# Patient Record
Sex: Female | Born: 1980 | Race: Black or African American | Hispanic: No | Marital: Married | Smoking: Never smoker
Health system: Southern US, Community
[De-identification: ages and names within clinical notes are randomized; demographics above are authoritative.]

## PROBLEM LIST (undated history)

## (undated) ENCOUNTER — Inpatient Hospital Stay (HOSPITAL_COMMUNITY): Payer: Self-pay

## (undated) DIAGNOSIS — O24419 Gestational diabetes mellitus in pregnancy, unspecified control: Secondary | ICD-10-CM

## (undated) DIAGNOSIS — Z9889 Other specified postprocedural states: Secondary | ICD-10-CM

## (undated) DIAGNOSIS — T4145XA Adverse effect of unspecified anesthetic, initial encounter: Secondary | ICD-10-CM

## (undated) DIAGNOSIS — R112 Nausea with vomiting, unspecified: Secondary | ICD-10-CM

## (undated) DIAGNOSIS — T8859XA Other complications of anesthesia, initial encounter: Secondary | ICD-10-CM

---

## 2009-04-05 ENCOUNTER — Other Ambulatory Visit: Admission: RE | Admit: 2009-04-05 | Discharge: 2009-04-05 | Payer: Self-pay | Admitting: Obstetrics and Gynecology

## 2011-09-20 ENCOUNTER — Emergency Department (HOSPITAL_COMMUNITY): Payer: Self-pay

## 2011-09-20 ENCOUNTER — Emergency Department (HOSPITAL_COMMUNITY)
Admission: EM | Admit: 2011-09-20 | Discharge: 2011-09-20 | Disposition: A | Discharge status: Home / Self Care | Payer: Self-pay | Attending: Emergency Medicine | Admitting: Emergency Medicine

## 2011-09-20 DIAGNOSIS — N949 Unspecified condition associated with female genital organs and menstrual cycle: Secondary | ICD-10-CM | POA: Insufficient documentation

## 2011-09-20 DIAGNOSIS — R109 Unspecified abdominal pain: Secondary | ICD-10-CM | POA: Insufficient documentation

## 2011-09-20 DIAGNOSIS — N83209 Unspecified ovarian cyst, unspecified side: Secondary | ICD-10-CM | POA: Insufficient documentation

## 2011-09-20 DIAGNOSIS — R10819 Abdominal tenderness, unspecified site: Secondary | ICD-10-CM | POA: Insufficient documentation

## 2011-09-20 DIAGNOSIS — R300 Dysuria: Secondary | ICD-10-CM | POA: Insufficient documentation

## 2011-09-20 DIAGNOSIS — N898 Other specified noninflammatory disorders of vagina: Secondary | ICD-10-CM | POA: Insufficient documentation

## 2011-09-20 LAB — BASIC METABOLIC PANEL
BUN: 10 mg/dL (ref 6–23)
CO2: 28 mEq/L (ref 19–32)
Calcium: 10.2 mg/dL (ref 8.4–10.5)
Chloride: 104 mEq/L (ref 96–112)
Creatinine, Ser: 0.85 mg/dL (ref 0.50–1.10)
GFR calc Af Amer: >90 mL/min (ref >90)
GFR calc non Af Amer: >90 mL/min (ref >90)
Glucose, Bld: 101 mg/dL — ABNORMAL HIGH (ref 70–99)
Potassium: 4.1 mEq/L (ref 3.5–5.1)
Sodium: 141 mEq/L (ref 135–145)

## 2011-09-20 LAB — URINALYSIS, ROUTINE W REFLEX MICROSCOPIC
Bilirubin Urine: NEGATIVE
Glucose, UA: NEGATIVE mg/dL
Ketones, ur: NEGATIVE mg/dL
Leukocytes, UA: NEGATIVE
Nitrite: NEGATIVE
Protein, ur: NEGATIVE mg/dL
Specific Gravity, Urine: 1.014 (ref 1.005–1.030)
Urobilinogen, UA: 0.2 mg/dL (ref 0.0–1.0)
pH: 6 (ref 5.0–8.0)

## 2011-09-20 LAB — DIFFERENTIAL
Basophils Absolute: 0 10*3/uL (ref 0.0–0.1)
Basophils Relative: 0 % (ref 0–1)
Eosinophils Absolute: 0.4 10*3/uL (ref 0.0–0.7)
Eosinophils Relative: 3 % (ref 0–5)
Lymphocytes Relative: 31 % (ref 12–46)
Lymphs Abs: 3.4 10*3/uL (ref 0.7–4.0)
Monocytes Absolute: 0.6 10*3/uL (ref 0.1–1.0)
Monocytes Relative: 6 % (ref 3–12)
Neutro Abs: 6.5 10*3/uL (ref 1.7–7.7)
Neutrophils Relative %: 60 % (ref 43–77)

## 2011-09-20 LAB — CBC
HCT: 35.6 % — ABNORMAL LOW (ref 36.0–46.0)
Hemoglobin: 12.4 g/dL (ref 12.0–15.0)
MCH: 29.5 pg (ref 26.0–34.0)
MCHC: 34.8 g/dL (ref 30.0–36.0)
MCV: 84.6 fL (ref 78.0–100.0)
Platelets: 312 10*3/uL (ref 150–400)
RBC: 4.21 MIL/uL (ref 3.87–5.11)
RDW: 12.5 % (ref 11.5–15.5)
WBC: 10.8 10*3/uL — ABNORMAL HIGH (ref 4.0–10.5)

## 2011-09-20 LAB — WET PREP, GENITAL
Clue Cells Wet Prep HPF POC: NONE SEEN
Trich, Wet Prep: NONE SEEN
Yeast Wet Prep HPF POC: NONE SEEN

## 2011-09-20 LAB — POCT PREGNANCY, URINE: Preg Test, Ur: NEGATIVE

## 2011-09-22 LAB — GC/CHLAMYDIA PROBE AMP, GENITAL
Chlamydia, DNA Probe: NEGATIVE
GC Probe Amp, Genital: NEGATIVE

## 2012-02-20 ENCOUNTER — Encounter (HOSPITAL_COMMUNITY): Payer: Self-pay | Admitting: *Deleted

## 2012-02-20 ENCOUNTER — Emergency Department (HOSPITAL_COMMUNITY): Payer: Medicaid Other

## 2012-02-20 ENCOUNTER — Emergency Department (HOSPITAL_COMMUNITY)
Admission: EM | Admit: 2012-02-20 | Discharge: 2012-02-21 | Disposition: A | Discharge status: Home / Self Care | Payer: Medicaid Other | Attending: Emergency Medicine | Admitting: Emergency Medicine

## 2012-02-20 DIAGNOSIS — Z3201 Encounter for pregnancy test, result positive: Secondary | ICD-10-CM

## 2012-02-20 DIAGNOSIS — O2690 Pregnancy related conditions, unspecified, unspecified trimester: Secondary | ICD-10-CM | POA: Insufficient documentation

## 2012-02-20 DIAGNOSIS — O26899 Other specified pregnancy related conditions, unspecified trimester: Secondary | ICD-10-CM

## 2012-02-20 DIAGNOSIS — R109 Unspecified abdominal pain: Secondary | ICD-10-CM | POA: Insufficient documentation

## 2012-02-20 LAB — URINALYSIS, ROUTINE W REFLEX MICROSCOPIC
Bilirubin Urine: NEGATIVE
Glucose, UA: 100 mg/dL — AB
Hgb urine dipstick: NEGATIVE
Ketones, ur: NEGATIVE mg/dL
Nitrite: NEGATIVE
Protein, ur: NEGATIVE mg/dL
Specific Gravity, Urine: 1.012 (ref 1.005–1.030)
Urobilinogen, UA: 0.2 mg/dL (ref 0.0–1.0)
pH: 8 (ref 5.0–8.0)

## 2012-02-20 LAB — URINE MICROSCOPIC-ADD ON

## 2012-02-20 LAB — WET PREP, GENITAL
Clue Cells Wet Prep HPF POC: NONE SEEN
Trich, Wet Prep: NONE SEEN
Yeast Wet Prep HPF POC: NONE SEEN

## 2012-02-20 LAB — POCT PREGNANCY, URINE: Preg Test, Ur: POSITIVE — AB

## 2012-02-20 LAB — HCG, QUANTITATIVE, PREGNANCY: hCG, Beta Chain, Quant, S: 58013 m[IU]/mL — ABNORMAL HIGH (ref <5)

## 2012-02-20 NOTE — ED Provider Notes (Signed)
History     CSN: [id number]  Arrival date & time 02/20/12  1843   First MD Initiated Contact with Patient 02/20/12 2131      Chief Complaint  Patient presents with  . Abdominal Pain    (Consider location/radiation/quality/duration/timing/severity/associated sxs/prior treatment) HPI  G4 P3 female who is [redacted] weeks pregnant presents with chief complaints of suprapubic/right lower quadrant abdominal pain. States pain started this a.m. while she was sitting. Pain is acute in onset, worsening with sitting, improves with laying down. Pain is constant, sharp and radiating around her abdomen. She has nausea but states she has been having nausea since pregnancy. She denies vomiting, diarrhea, constipation, dysuria, abnormal vaginal bleeding, or vaginal discharge. She denies any recent trauma, denies any rash. Patient denies fever, chest pain, shortness of breath, or back pain. She was diagnosed with bacterial vaginosis 4 days ago and currently taking MetroGel.  History reviewed. No pertinent past medical history.  History reviewed. No pertinent past surgical history.  No family history on file.  History  Substance Use Topics  . Smoking status: Not on file  . Smokeless tobacco: Not on file  . Alcohol Use: No    OB History    Grav Para Term Preterm Abortions TAB SAB Ect Mult Living   1               Review of Systems  All other systems reviewed and are negative.    Allergies  Review of patient's allergies indicates no known allergies.  Home Medications   Current Outpatient Rx  Name Route Sig Dispense Refill  . PRENATAL 27-0.8 MG PO TABS Oral Take 1 tablet by mouth daily.      BP 121/74  Pulse 100  Temp(Src) 98 F (36.7 C) (Oral)  Resp 18  SpO2 100%  LMP 12/31/2011  Physical Exam  Nursing note and vitals reviewed. Constitutional: She appears well-developed and well-nourished. No distress.  HENT:  Head: Normocephalic and atraumatic.  Eyes: Conjunctivae are normal.    Neck: Normal range of motion. Neck supple.  Cardiovascular: Normal rate and regular rhythm.   Pulmonary/Chest: Effort normal and breath sounds normal. She exhibits no tenderness.  Abdominal: Soft. There is tenderness in the suprapubic area.  Genitourinary: Pelvic exam was performed with patient supine. There is no rash or lesion on the right labia. There is no rash or lesion on the left labia. Uterus is tender. Cervix exhibits discharge. Cervix exhibits no motion tenderness. Right adnexum displays no mass and no tenderness. Left adnexum displays tenderness. Left adnexum displays no mass. No erythema, tenderness or bleeding around the vagina. Vaginal discharge found.  Lymphadenopathy:       Right: No inguinal adenopathy present.       Left: No inguinal adenopathy present.    ED Course  Procedures (including critical care time)  Labs Reviewed  POCT PREGNANCY, URINE - Abnormal; Notable for the following:    Preg Test, Ur POSITIVE (*)    All other components within normal limits  URINALYSIS, ROUTINE W REFLEX MICROSCOPIC   No results found.   No diagnosis found.  Results for orders placed during the hospital encounter of 02/20/12  URINALYSIS, ROUTINE W REFLEX MICROSCOPIC      Component Value Range   Color, Urine YELLOW  YELLOW    APPearance CLEAR  CLEAR    Specific Gravity, Urine 1.012  1.005 - 1.030    pH 8.0  5.0 - 8.0    Glucose, UA 100 (*) NEGATIVE (mg/dL)  Hgb urine dipstick NEGATIVE  NEGATIVE    Bilirubin Urine NEGATIVE  NEGATIVE    Ketones, ur NEGATIVE  NEGATIVE (mg/dL)   Protein, ur NEGATIVE  NEGATIVE (mg/dL)   Urobilinogen, UA 0.2  0.0 - 1.0 (mg/dL)   Nitrite NEGATIVE  NEGATIVE    Leukocytes, UA SMALL (*) NEGATIVE   POCT PREGNANCY, URINE      Component Value Range   Preg Test, Ur POSITIVE (*) NEGATIVE   URINE MICROSCOPIC-ADD ON      Component Value Range   Squamous Epithelial / LPF MANY (*) RARE    WBC, UA 3-6  <3 (WBC/hpf)   RBC / HPF 0-2  <3 (RBC/hpf)    Bacteria, UA FEW (*) RARE   HCG, QUANTITATIVE, PREGNANCY      Component Value Range   hCG, Beta Chain, Quant, S 58013 (*) <5 (mIU/mL)  WET PREP, GENITAL      Component Value Range   Yeast Wet Prep HPF POC NONE SEEN  NONE SEEN    Trich, Wet Prep NONE SEEN  NONE SEEN    Clue Cells Wet Prep HPF POC NONE SEEN  NONE SEEN    WBC, Wet Prep HPF POC FEW (*) NONE SEEN    US Ob Comp Less 14 Wks  02/21/2012  *RADIOLOGY REPORT*  Clinical Data: ABDOMINAL PAIN pregnancy,preg; ;  OBSTETRIC <14 WK [place] AND TRANSVAGINAL OB US  Technique: Both transabdominal and transvaginal ultrasound examinations were performed for complete evaluation of the gestation as well as the maternal uterus, adnexal regions, and pelvic cul-de-sac.  Comparison: None.  Findings: There is a single intrauterine pregnancy.  Crown-rump length is 10.3 cm for estimated gestational age of [redacted] weeks 1 day. Fetal heart rate 133 beats per minute.  No subchorionic hemorrhage.  Ovaries are symmetric in size and echotexture.  No adnexal mass. Right corpus luteal cyst.  No free fluid.  IMPRESSION: 7-week-1-day intrauterine pregnancy.  Fetal heart rate 103 beats per minute.  Original Report Authenticated By: Cyndie Chime, M.D.   US Ob Transvaginal  02/21/2012  *RADIOLOGY REPORT*  Clinical Data: ABDOMINAL PAIN pregnancy,preg; ;  OBSTETRIC <14 WK [place] AND TRANSVAGINAL OB US  Technique: Both transabdominal and transvaginal ultrasound examinations were performed for complete evaluation of the gestation as well as the maternal uterus, adnexal regions, and pelvic cul-de-sac.  Comparison: None.  Findings: There is a single intrauterine pregnancy.  Crown-rump length is 10.3 cm for estimated gestational age of [redacted] weeks 1 day. Fetal heart rate 133 beats per minute.  No subchorionic hemorrhage.  Ovaries are symmetric in size and echotexture.  No adnexal mass. Right corpus luteal cyst.  No free fluid.  IMPRESSION: 7-week-1-day intrauterine pregnancy.  Fetal heart rate 103  beats per minute.  Original Report Authenticated By: Cyndie Chime, M.D.      MDM  Abdominal pain, currently pregnant. Will obtain Quant, pelvic examination, and transvaginal ultrasound for further evaluation. Consider musculoskeletal pain, UTI, appy, ovarian cyst, ectopic, tuboovarian abscess, ovarian torsion.  11:16 PM Urine pregnancy test is positive. HCG Quant is 58,000. UA shows no significant evidence of urinary tract infection. Pelvic examination is significant for mild cervical tenderness and moderate curd-like discharge.  Some tenderness to left adnexa. Cervical os is closed.     12:46 AM No significant finding on today's exam.  Pt sts pain has improved.  She has an appetite and able to eat.  No signs of infection.  US shows a 7 weeks and 1 day intrauterine pregnancy with fetal  heart rate of 103 bpm.  Reassurance given.  Recommend f/u with OBGYN. Pt voice understanding and agrees with plan.    Fayrene Helper, PA-C 02/21/12 0047  Fayrene Helper, PA-C 02/21/12 [phone]

## 2012-02-20 NOTE — ED Notes (Signed)
Pt is pregnant.  LMP 2/6.  She is here for RLQ abdominal pain.  Pt has nausea related to pregnancy

## 2012-02-21 NOTE — Discharge Instructions (Signed)
Abdominal Pain During Pregnancy °Belly (abdominal) pain is common during pregnancy. Most of the time, it is not a serious problem. Other times, it can be a sign that something is wrong with the pregnancy. Always tell your doctor if you have belly pain. °HOME CARE °For mild pain: °· Do not have sex (intercourse) or put anything in your vagina until you feel better.  °· Rest until your pain stops. If your pain lasts longer than 1 hour, call your doctor.  °· Drink clear fluids if you feel sick to your stomach (nauseous).  °· Do not eat solid food until you feel better.  °· Only take medicine as told by your doctor.  °· Keep all doctor visits as told.  °GET HELP RIGHT AWAY IF:  °· You are bleeding, leaking fluid, or pieces of tissue come out of your vagina.  °· You have more pain or cramping.  °· You keep throwing up (vomiting).  °· You have pain when you pee (urinate) or have blood in your pee.  °· You have a fever.  °· You do not feel your baby moving as much.  °· You feel very weak or feel like passing out.  °· You have trouble breathing, with or without belly pain.  °· You have a very bad headache and belly pain.  °· You have fluid leaking from your vagina and belly pain.  °· You keep having watery poop (diarrhea).  °· Your belly pain does not go away after resting, or the pain gets worse.  °MAKE SURE YOU:  °· Understand these instructions.  °· Will watch your condition.  °· Will get help right away if you are not doing well or get worse.  °Document Released: 10/29/2009 Document Revised: 10/30/2011 Document Reviewed: 06/06/2011 °[employer]® Patient Information ©2012 [employer]. °

## 2012-02-21 NOTE — ED Notes (Signed)
Patient transported from Ultrasound 

## 2012-02-22 NOTE — ED Provider Notes (Signed)
Medical screening examination/treatment/procedure(s) were performed by non-physician practitioner and as supervising physician I was immediately available for consultation/collaboration.  Ruthanna Macchia R. Srihitha Tagliaferri, MD 02/22/12 0015 

## 2012-02-23 LAB — GC/CHLAMYDIA PROBE AMP, GENITAL
Chlamydia, DNA Probe: NEGATIVE
GC Probe Amp, Genital: NEGATIVE

## 2012-03-23 LAB — OB RESULTS CONSOLE RUBELLA ANTIBODY, IGM: Rubella: IMMUNE

## 2012-03-23 LAB — OB RESULTS CONSOLE ABO/RH: RH Type: POSITIVE

## 2012-03-23 LAB — OB RESULTS CONSOLE RPR: RPR: NONREACTIVE

## 2012-03-23 LAB — OB RESULTS CONSOLE HIV ANTIBODY (ROUTINE TESTING): HIV: NONREACTIVE

## 2012-03-23 LAB — OB RESULTS CONSOLE HEPATITIS B SURFACE ANTIGEN: Hepatitis B Surface Ag: NEGATIVE

## 2012-03-23 LAB — OB RESULTS CONSOLE ANTIBODY SCREEN: Antibody Screen: NEGATIVE

## 2012-05-12 ENCOUNTER — Inpatient Hospital Stay (HOSPITAL_COMMUNITY)
Admission: AD | Admit: 2012-05-12 | Discharge: 2012-05-12 | Disposition: A | Discharge status: Hospice | Payer: Medicaid Other | Source: Ambulatory Visit | Attending: Obstetrics and Gynecology | Admitting: Obstetrics and Gynecology

## 2012-05-12 ENCOUNTER — Encounter (HOSPITAL_COMMUNITY): Payer: Self-pay | Admitting: *Deleted

## 2012-05-12 DIAGNOSIS — M5380 Other specified dorsopathies, site unspecified: Secondary | ICD-10-CM | POA: Insufficient documentation

## 2012-05-12 DIAGNOSIS — R109 Unspecified abdominal pain: Secondary | ICD-10-CM | POA: Insufficient documentation

## 2012-05-12 DIAGNOSIS — O99891 Other specified diseases and conditions complicating pregnancy: Secondary | ICD-10-CM | POA: Insufficient documentation

## 2012-05-12 DIAGNOSIS — M549 Dorsalgia, unspecified: Secondary | ICD-10-CM | POA: Insufficient documentation

## 2012-05-12 DIAGNOSIS — Z331 Pregnant state, incidental: Secondary | ICD-10-CM

## 2012-05-12 DIAGNOSIS — M62830 Muscle spasm of back: Secondary | ICD-10-CM

## 2012-05-12 LAB — URINE MICROSCOPIC-ADD ON

## 2012-05-12 LAB — URINALYSIS, ROUTINE W REFLEX MICROSCOPIC
Bilirubin Urine: NEGATIVE
Glucose, UA: 100 mg/dL — AB
Ketones, ur: NEGATIVE mg/dL
Leukocytes, UA: NEGATIVE
Nitrite: NEGATIVE
Protein, ur: NEGATIVE mg/dL
Specific Gravity, Urine: 1.01 (ref 1.005–1.030)
Urobilinogen, UA: 0.2 mg/dL (ref 0.0–1.0)
pH: 7 (ref 5.0–8.0)

## 2012-05-12 MED ORDER — CYCLOBENZAPRINE HCL 10 MG PO TABS: 10.0000 mg | ORAL_TABLET | Freq: Once | ORAL | Status: AC

## 2012-05-12 MED ORDER — CYCLOBENZAPRINE HCL 10 MG PO TABS
10.0000 mg | ORAL_TABLET | Freq: Once | ORAL | Status: AC
  Administered 2012-05-12: 10 mg via ORAL
  Filled 2012-05-12: qty 1

## 2012-05-12 NOTE — MAU Note (Signed)
Pt reports she just finished antibiotics for a UTI about 4 days ago, today began having lower abd and lower back pain. Lower back pain is constant. Denies bleeding.

## 2012-05-12 NOTE — Discharge Instructions (Signed)
Back Pain in Pregnancy  Back pain during pregnancy is common. It happens in about half of all pregnancies. It is important for you and your baby that you remain active during your pregnancy.If you feel that back pain is not allowing you to remain active or sleep well, it is time to see your caregiver. Back pain may be caused by several factors related to changes during your pregnancy.Fortunately, unless you had trouble with your back before your pregnancy, the pain is likely to get better after you deliver.  Low back pain usually occurs between the fifth and seventh months of pregnancy. It can, however, happen in the first couple months. Factors that increase the risk of back problems include:    Previous back problems.   Injury to your back.   Having twins or multiple births.   A chronic cough.   Stress.   Job-related repetitive motions.   Muscle or spinal disease in the back.   Family history of back problems, ruptured (herniated) discs, or osteoporosis.   Depression, anxiety, and panic attacks.  CAUSES    When you are pregnant, your body produces a hormone called relaxin. This hormonemakes the ligaments connecting the low back and pubic bones more flexible. This flexibility allows the baby to be delivered more easily. When your ligaments are loose, your muscles need to work harder to support your back. Soreness in your back can come from tired muscles. Soreness can also come from back tissues that are irritated since they are receiving less support.   As the baby grows, it puts pressure on the nerves and blood vessels in your pelvis. This can cause back pain.   As the baby grows and gets heavier during pregnancy, the uterus pushes the stomach muscles forward and changes your center of gravity. This makes your back muscles work harder to maintain good posture.  SYMPTOMS   Lumbar pain during pregnancy  Lumbar pain during pregnancy usually occurs at or above the waist in the center of the back. There  may be pain and numbness that radiates into your leg or foot. This is similar to low back pain experienced by non-pregnant women. It usually increases with sitting for long periods of time, standing, or repetitive lifting. Tenderness may also be present in the muscles along your upper back.  Posterior pelvic pain during pregnancy  Pain in the back of the pelvis is more common than lumbar pain in pregnancy. It is a deep pain felt in your side at the waistline, or across the tailbone (sacrum), or in both places. You may have pain on one or both sides. This pain can also go into the buttocks and backs of the upper thighs. Pubic and groin pain may also be present. The pain does not quickly resolve with rest, and morning stiffness may also be present.  Pelvic pain during pregnancy can be brought on by most activities. A high level of fitness before and during pregnancy may or may not prevent this problem. Labor pain is usually 1 to 2 minutes apart, lasts for about 1 minute, and involves a bearing down feeling or pressure in your pelvis. However, if you are at term with the pregnancy, constant low back pain can be the beginning of early labor, and you should be aware of this.  DIAGNOSIS   X-rays of the back should not be done during the first 12 to 14 weeks of the pregnancy and only when absolutely necessary during the rest of the pregnancy. MRIs do   not give off radiation and are safe during pregnancy. MRIs also should only be done when absolutely necessary.  HOME CARE INSTRUCTIONS   Exercise as directed by your caregiver. Exercise is the most effective way to prevent or manage back pain. If you have a back problem, it is especially important to avoid sports that require sudden body movements. Swimming and walking are great activities.   Do not stand in one place for long periods of time.   Do not wear high heels.   Sit in chairs with good posture. Use a pillow on your lower back if necessary. Make sure your head  rests over your shoulders and is not hanging forward.   Try sleeping on your side, preferably the left side, with a pillow or two between your legs. If you are sore after a night's rest, your bedmay betoo soft.Try placing a board between your mattress and box spring.   Listen to your body when lifting.If you are experiencing pain, ask for help or try bending yourknees more so you can use your leg muscles rather than your back muscles. Squat down when picking up something from the floor. Do not bend over.   Eat a healthy diet. Try to gain weight within your caregiver's recommendations.   Use heat or cold packs 3 to 4 times a day for 15 minutes to help with the pain.   Only take over-the-counter or prescription medicines for pain, discomfort, or fever as directed by your caregiver.  Sudden (acute) back pain   Use bed rest for only the most extreme, acute episodes of back pain. Prolonged bed rest over 48 hours will aggravate your condition.   Ice is very effective for acute conditions.   Put ice in a plastic bag.   Place a towel between your skin and the bag.   Leave the ice on for 10 to 20 minutes every 2 hours, or as needed.   Using heat packs for 30 minutes prior to activities is also helpful.  Continued back pain  See your caregiver if you have continued problems. Your caregiver can help or refer you for appropriate physical therapy. With conditioning, most back problems can be avoided. Sometimes, a more serious issue may be the cause of back pain. You should be seen right away if new problems seem to be developing. Your caregiver may recommend:   A maternity girdle.   An elastic sling.   A back brace.   A massage therapist or acupuncture.  SEEK MEDICAL CARE IF:    You are not able to do most of your daily activities, even when taking the pain medicine you were given.   You need a referral to a physical therapist or chiropractor.   You want to try acupuncture.  SEEK IMMEDIATE MEDICAL CARE  IF:   You develop numbness, tingling, weakness, or problems with the use of your arms or legs.   You develop severe back pain that is no longer relieved with medicines.   You have a sudden change in bowel or bladder control.   You have increasing pain in other areas of the body.   You develop shortness of breath, dizziness, or fainting.   You develop nausea, vomiting, or sweating.   You have back pain which is similar to labor pains.   You have back pain along with your water breaking or vaginal bleeding.   You have back pain or numbness that travels down your leg.   Your back pain developed after   kidney stone.   You see blood in your urine. You may have a bladder infection or kidney stone.   You have back pain with blisters. You may have shingles.  Back pain is fairly common during pregnancy but should not be accepted as just part of the process. Back pain should always be treated as soon as possible. This will make your pregnancy as pleasant as possible. Document Released: 02/18/2006 Document Revised: 10/30/2011 Document Reviewed: 04/01/2011 [facility] Patient Information 2012 [place], [place].Muscle Cramps Muscle cramps are due to sudden involuntary muscle contraction. This means you have no control over the tightening of a muscle (or muscles). Often there are no obvious causes. Muscle cramps may occur with overexertion. They may also occur with chilling of the muscles. An example of a muscle chilling activity is swimming. It is uncommon for cramps to be due to a serious underlying disorder. In most cases, muscle cramps improve (or leave) within minutes. CAUSES  Some common causes are:  Injury.   Infections, especially viral.   Abnormal levels of the salts and ions in your blood (electrolytes). This could happen if you are taking water pills (diuretics).   Blood vessel disease  where not enough blood is getting to the muscles (intermittent claudication).  Some uncommon causes are:  Side effects of some medicine (such as lithium).   Alcohol abuse.   Diseases where there is soreness (inflammation) of the muscular system.  HOME CARE INSTRUCTIONS   It may be helpful to massage, stretch, and relax the affected muscle.   Taking a dose of over-the-counter diphenhydramine is helpful for night leg cramps.  SEEK MEDICAL CARE IF:  Cramps are frequent and not relieved with medicine. MAKE SURE YOU:   Understand these instructions.   Will watch your condition.   Will get help right away if you are not doing well or get worse.  Document Released: 05/02/2002 Document Revised: 10/30/2011 Document Reviewed: 11/01/2008 [facility] Patient Information 2012 [place], [place].

## 2012-05-12 NOTE — MAU Note (Signed)
Pt c/o constant low back pain, intermittant, lower abd pain, and diarrhea X4 today.  Has not taken anything for the pain.

## 2012-05-12 NOTE — MAU Provider Note (Signed)
Chief Complaint:  Abdominal Pain and Back Pain    First Provider Initiated Contact with Patient 05/12/12 2135      Jacqueline Carter is  31 y.o. [id number].  Patient's last menstrual period was 12/31/2011.. [redacted]w[redacted]d  She presents complaining of Abdominal Pain and Back Pain . Onset is described as sudden and has been present for  2 hours. Reports sudden left lower back pain after sneezing and hard fetal kick. States she had some lower abd cramping at home but has now resolved. Pain is worse with movement and palpation. Reports + FM, denies vaginal bleeding, dysuria, LOF, fever, chills, Nausea or vomiting.   Reports she completed macrobid 2 days ago for UTI.   Obstetrical/Gynecological History: [id number]  Past Medical History: No past medical history on file.  Past Surgical History: No past surgical history on file.  Family History: No family history on file.  Social History: History  Substance Use Topics  . Smoking status: Not on file  . Smokeless tobacco: Not on file  . Alcohol Use: No    Allergies: No Known Allergies  Prescriptions prior to admission  Medication Sig Dispense Refill  . nitrofurantoin (MACRODANTIN) 100 MG capsule Take 100 mg by mouth 4 (four) times daily. uti      . Prenatal Vit-Fe Fumarate-FA (MULTIVITAMIN-PRENATAL) 27-0.8 MG TABS Take 1 tablet by mouth daily.        Review of Systems - Negative except what has been reviewed in the HPI  Physical Exam   Blood pressure 101/72, pulse 97, temperature 99.4 F (37.4 C), temperature source Oral, resp. rate 18, height 5\' 1"  (1.549 m), weight 203 lb (92.08 kg), last menstrual period 12/31/2011, SpO2 100.00%.  General: General appearance - alert, well appearing, and in no distress, oriented to person, place, and time and overweight Eyes - left eye normal, right eye normal Abdomen - soft, nontender, nondistended, no masses or organomegaly no CVA tenderness Back exam - + soft tissue tenderness of left lumbar paraspinal  muscles with palpation Focused Gynecological Exam: Cervix: closed/thick/long/firm  Labs: Recent Results (from the past 24 hour(s))  URINALYSIS, ROUTINE W REFLEX MICROSCOPIC   Collection Time   05/12/12  8:05 PM      Component Value Range   Color, Urine YELLOW  YELLOW   APPearance CLEAR  CLEAR   Specific Gravity, Urine 1.010  1.005 - 1.030   pH 7.0  5.0 - 8.0   Glucose, UA 100 (*) NEGATIVE mg/dL   Hgb urine dipstick TRACE (*) NEGATIVE   Bilirubin Urine NEGATIVE  NEGATIVE   Ketones, ur NEGATIVE  NEGATIVE mg/dL   Protein, ur NEGATIVE  NEGATIVE mg/dL   Urobilinogen, UA 0.2  0.0 - 1.0 mg/dL   Nitrite NEGATIVE  NEGATIVE   Leukocytes, UA NEGATIVE  NEGATIVE  URINE MICROSCOPIC-ADD ON   Collection Time   05/12/12  8:05 PM      Component Value Range   Squamous Epithelial / LPF RARE  RARE   WBC, UA 0-2  <3 WBC/hpf   RBC / HPF 0-2  <3 RBC/hpf   MD Consult: Discussed with Dr. Regis Bill  Assessment: 1. Muscle spasm of back   2. Pregnant state, incidental      Plan: Discharge home Flexeril in MAU, Rx #15 sent to pharmacy Comfort measures reviewed FU in office next Tuesday as scheduled  [name] 05/12/2012,9:48 PM

## 2012-08-11 ENCOUNTER — Encounter: Payer: Medicaid Other | Attending: Obstetrics and Gynecology | Admitting: *Deleted

## 2012-08-11 DIAGNOSIS — Z713 Dietary counseling and surveillance: Secondary | ICD-10-CM | POA: Insufficient documentation

## 2012-08-11 DIAGNOSIS — O99810 Abnormal glucose complicating pregnancy: Secondary | ICD-10-CM | POA: Insufficient documentation

## 2012-08-17 ENCOUNTER — Encounter: Payer: Self-pay | Admitting: *Deleted

## 2012-08-17 NOTE — Progress Notes (Signed)
  Patient was seen on 08/11/2012 for Gestational Diabetes self-management class at the [facility]. The following learning objectives were met by the patient during this course:   States the definition of Gestational Diabetes  States why dietary management is important in controlling blood glucose  Describes the effects each nutrient has on blood glucose levels  Demonstrates ability to create a balanced meal plan  Demonstrates carbohydrate counting   States when to check blood glucose levels  Demonstrates proper blood glucose monitoring techniques  States the effect of stress and exercise on blood glucose levels  States the importance of limiting caffeine and abstaining from alcohol and smoking  Blood glucose monitor given: Accu Chek Nano BG Monitoring Kit Lot # [id number] Exp: 12/2015 Blood glucose reading: 91 mg/dl  Patient instructed to monitor glucose levels: FBS: 60 - <90 2 hour: <120  *Patient received handouts:  Nutrition Diabetes and Pregnancy  Carbohydrate Counting List  Patient will be seen for follow-up as needed.

## 2012-08-17 NOTE — Patient Instructions (Signed)
Goals:  Check glucose levels per MD as instructed  Follow Gestational Diabetes Diet as instructed  Call for follow-up as needed    

## 2012-09-14 ENCOUNTER — Encounter (HOSPITAL_COMMUNITY): Payer: Self-pay | Admitting: Pharmacist

## 2012-09-20 ENCOUNTER — Encounter (HOSPITAL_COMMUNITY): Payer: Self-pay

## 2012-09-22 ENCOUNTER — Encounter (HOSPITAL_COMMUNITY)
Admission: RE | Admit: 2012-09-22 | Discharge: 2012-09-22 | Disposition: A | Discharge status: Home / Self Care | Payer: Medicaid Other | Source: Ambulatory Visit | Attending: Obstetrics & Gynecology | Admitting: Obstetrics & Gynecology

## 2012-09-22 ENCOUNTER — Encounter (HOSPITAL_COMMUNITY): Payer: Self-pay

## 2012-09-22 HISTORY — DX: Nausea with vomiting, unspecified: R11.2

## 2012-09-22 HISTORY — DX: Other specified postprocedural states: Z98.890

## 2012-09-22 HISTORY — DX: Other complications of anesthesia, initial encounter: T88.59XA

## 2012-09-22 HISTORY — DX: Adverse effect of unspecified anesthetic, initial encounter: T41.45XA

## 2012-09-22 LAB — CBC
HCT: 36.7 % (ref 36.0–46.0)
Hemoglobin: 12.5 g/dL (ref 12.0–15.0)
MCH: 29.8 pg (ref 26.0–34.0)
MCHC: 34.1 g/dL (ref 30.0–36.0)
MCV: 87.6 fL (ref 78.0–100.0)
Platelets: 235 10*3/uL (ref 150–400)
RBC: 4.19 MIL/uL (ref 3.87–5.11)
RDW: 13.5 % (ref 11.5–15.5)
WBC: 10.9 10*3/uL — ABNORMAL HIGH (ref 4.0–10.5)

## 2012-09-22 LAB — TYPE AND SCREEN
ABO/RH(D): A POS
Antibody Screen: NEGATIVE

## 2012-09-22 LAB — BASIC METABOLIC PANEL
BUN: 5 mg/dL — ABNORMAL LOW (ref 6–23)
CO2: 23 mEq/L (ref 19–32)
Calcium: 9.8 mg/dL (ref 8.4–10.5)
Chloride: 102 mEq/L (ref 96–112)
Creatinine, Ser: 0.64 mg/dL (ref 0.50–1.10)
GFR calc Af Amer: >90 mL/min (ref >90)
GFR calc non Af Amer: >90 mL/min (ref >90)
Glucose, Bld: 85 mg/dL (ref 70–99)
Potassium: 4.1 mEq/L (ref 3.5–5.1)
Sodium: 137 mEq/L (ref 135–145)

## 2012-09-22 LAB — SURGICAL PCR SCREEN
MRSA, PCR: NEGATIVE
Staphylococcus aureus: NEGATIVE

## 2012-09-22 LAB — RPR: RPR Ser Ql: NONREACTIVE

## 2012-09-22 LAB — ABO/RH: ABO/RH(D): A POS

## 2012-09-22 NOTE — Patient Instructions (Addendum)
20 Jacqueline Carter  09/22/2012   Your procedure is scheduled on:  09/29/12  Enter through the Main Entrance of [facility] at 930 AM.  Pick up the phone at the desk and dial [phone].   Call this number if you have problems the morning of surgery: [phone]   Remember:   Do not eat food:After Midnight.  Do not drink clear liquids: After Midnight.  Take these medicines the morning of surgery with A SIP OF WATER: NA   Do not wear jewelry, make-up or nail polish.  Do not wear lotions, powders, or perfumes. You may wear deodorant.  Do not shave 48 hours prior to surgery.  Do not bring valuables to the hospital.  Contacts, dentures or bridgework may not be worn into surgery.  Leave suitcase in the car. After surgery it may be brought to your room.  For patients admitted to the hospital, checkout time is 11:00 AM the day of discharge.   Patients discharged the day of surgery will not be allowed to drive home.  Name and phone number of your driver: NA  Special Instructions: Shower using CHG 2 nights before surgery and the night before surgery.  If you shower the day of surgery use CHG.  Use special wash - you have one bottle of CHG for all showers.  You should use approximately 1/3 of the bottle for each shower.   Please read over the following fact sheets that you were given: MRSA Information

## 2012-09-25 ENCOUNTER — Encounter (HOSPITAL_COMMUNITY): Payer: Self-pay | Admitting: *Deleted

## 2012-09-25 ENCOUNTER — Encounter (HOSPITAL_COMMUNITY): Payer: Self-pay | Admitting: Anesthesiology

## 2012-09-25 ENCOUNTER — Inpatient Hospital Stay (HOSPITAL_COMMUNITY): Payer: Medicaid Other | Admitting: Anesthesiology

## 2012-09-25 ENCOUNTER — Inpatient Hospital Stay (HOSPITAL_COMMUNITY)
Admission: AD | Admit: 2012-09-25 | Discharge: 2012-09-28 | DRG: 766 | Disposition: A | Discharge status: Home / Self Care | Payer: Medicaid Other | Source: Ambulatory Visit | Attending: Obstetrics and Gynecology | Admitting: Obstetrics and Gynecology

## 2012-09-25 ENCOUNTER — Encounter (HOSPITAL_COMMUNITY)
Admission: AD | Disposition: A | Discharge status: Home / Self Care | Payer: Self-pay | Source: Ambulatory Visit | Attending: Obstetrics and Gynecology

## 2012-09-25 DIAGNOSIS — Z01818 Encounter for other preprocedural examination: Secondary | ICD-10-CM

## 2012-09-25 DIAGNOSIS — O99814 Abnormal glucose complicating childbirth: Secondary | ICD-10-CM | POA: Diagnosis present

## 2012-09-25 DIAGNOSIS — Z01812 Encounter for preprocedural laboratory examination: Secondary | ICD-10-CM

## 2012-09-25 DIAGNOSIS — O34219 Maternal care for unspecified type scar from previous cesarean delivery: Principal | ICD-10-CM | POA: Diagnosis present

## 2012-09-25 DIAGNOSIS — Z3480 Encounter for supervision of other normal pregnancy, unspecified trimester: Secondary | ICD-10-CM

## 2012-09-25 DIAGNOSIS — D573 Sickle-cell trait: Secondary | ICD-10-CM | POA: Diagnosis present

## 2012-09-25 DIAGNOSIS — O9902 Anemia complicating childbirth: Secondary | ICD-10-CM | POA: Diagnosis present

## 2012-09-25 HISTORY — DX: Gestational diabetes mellitus in pregnancy, unspecified control: O24.419

## 2012-09-25 SURGERY — Surgical Case
Anesthesia: Spinal | Site: Abdomen | Wound class: Clean Contaminated

## 2012-09-25 MED ORDER — FAMOTIDINE IN NACL 20-0.9 MG/50ML-% IV SOLN
20.0000 mg | Freq: Once | INTRAVENOUS | Status: AC
  Administered 2012-09-26: 20 mg via INTRAVENOUS
  Filled 2012-09-25: qty 50

## 2012-09-25 MED ORDER — FENTANYL CITRATE 0.05 MG/ML IJ SOLN
INTRAMUSCULAR | Status: AC
  Filled 2012-09-25: qty 2

## 2012-09-25 MED ORDER — OXYTOCIN 10 UNIT/ML IJ SOLN
INTRAMUSCULAR | Status: AC
  Filled 2012-09-25: qty 4

## 2012-09-25 MED ORDER — ONDANSETRON HCL 4 MG/2ML IJ SOLN
INTRAMUSCULAR | Status: AC
  Filled 2012-09-25: qty 2

## 2012-09-25 MED ORDER — MORPHINE SULFATE 0.5 MG/ML IJ SOLN
INTRAMUSCULAR | Status: AC
  Filled 2012-09-25: qty 10

## 2012-09-25 MED ORDER — CITRIC ACID-SODIUM CITRATE 334-500 MG/5ML PO SOLN
30.0000 mL | Freq: Once | ORAL | Status: AC
  Administered 2012-09-26: 30 mL via ORAL
  Filled 2012-09-25: qty 15

## 2012-09-25 MED ORDER — CEFAZOLIN SODIUM-DEXTROSE 2-3 GM-% IV SOLR
INTRAVENOUS | Status: AC
  Filled 2012-09-25: qty 50

## 2012-09-25 MED ORDER — PHENYLEPHRINE 40 MCG/ML (10ML) SYRINGE FOR IV PUSH (FOR BLOOD PRESSURE SUPPORT)
PREFILLED_SYRINGE | INTRAVENOUS | Status: AC
  Filled 2012-09-25: qty 10

## 2012-09-25 MED ORDER — LACTATED RINGERS IV SOLN
INTRAVENOUS | Status: DC | PRN
  Administered 2012-09-25: via INTRAVENOUS

## 2012-09-25 MED ORDER — NIFEDIPINE 10 MG PO CAPS
20.0000 mg | ORAL_CAPSULE | Freq: Once | ORAL | Status: AC
  Administered 2012-09-25: 20 mg via ORAL
  Filled 2012-09-25: qty 2

## 2012-09-25 SURGICAL SUPPLY — 32 items
CLOTH BEACON ORANGE TIMEOUT ST (SAFETY) ×2 IMPLANT
DERMABOND ADVANCED (GAUZE/BANDAGES/DRESSINGS) ×2
DERMABOND ADVANCED .7 DNX12 (GAUZE/BANDAGES/DRESSINGS) ×2 IMPLANT
DRAPE SURG 17X23 STRL (DRAPES) ×2 IMPLANT
DRESSING TELFA 8X3 (GAUZE/BANDAGES/DRESSINGS) IMPLANT
DRSG COVADERM 4X10 (GAUZE/BANDAGES/DRESSINGS) IMPLANT
DURAPREP 26ML APPLICATOR (WOUND CARE) ×2 IMPLANT
ELECT REM PT RETURN 9FT ADLT (ELECTROSURGICAL) ×2
ELECTRODE REM PT RTRN 9FT ADLT (ELECTROSURGICAL) ×1 IMPLANT
EXTRACTOR VACUUM M CUP 4 TUBE (SUCTIONS) ×2 IMPLANT
GAUZE SPONGE 4X4 12PLY STRL LF (GAUZE/BANDAGES/DRESSINGS) IMPLANT
GLOVE BIO SURGEON STRL SZ7 (GLOVE) ×4 IMPLANT
GOWN PREVENTION PLUS LG XLONG (DISPOSABLE) ×4 IMPLANT
KIT ABG SYR 3ML LUER SLIP (SYRINGE) IMPLANT
NEEDLE HYPO 25X5/8 SAFETYGLIDE (NEEDLE) IMPLANT
NS IRRIG 1000ML POUR BTL (IV SOLUTION) ×2 IMPLANT
PACK C SECTION WH (CUSTOM PROCEDURE TRAY) ×2 IMPLANT
PAD ABD 7.5X8 STRL (GAUZE/BANDAGES/DRESSINGS) IMPLANT
PAD OB MATERNITY 4.3X12.25 (PERSONAL CARE ITEMS) ×2 IMPLANT
RTRCTR C-SECT PINK 25CM LRG (MISCELLANEOUS) ×2 IMPLANT
RTRCTR C-SECT PINK 34CM XLRG (MISCELLANEOUS) IMPLANT
SLEEVE SCD COMPRESS KNEE MED (MISCELLANEOUS) ×2 IMPLANT
STAPLER VISISTAT 35W (STAPLE) IMPLANT
SUT CHROMIC 1 CTX 36 (SUTURE) ×6 IMPLANT
SUT PDS AB 0 CTX 60 (SUTURE) ×2 IMPLANT
SUT PLAIN 2 0 XLH (SUTURE) ×2 IMPLANT
SUT VIC AB 2-0 CT1 27 (SUTURE) ×1
SUT VIC AB 2-0 CT1 TAPERPNT 27 (SUTURE) ×1 IMPLANT
SUT VIC AB 4-0 KS 27 (SUTURE) ×2 IMPLANT
TOWEL OR 17X24 6PK STRL BLUE (TOWEL DISPOSABLE) ×4 IMPLANT
TRAY FOLEY CATH 14FR (SET/KITS/TRAYS/PACK) ×2 IMPLANT
WATER STERILE IRR 1000ML POUR (IV SOLUTION) ×2 IMPLANT

## 2012-09-25 NOTE — MAU Note (Signed)
Pt reports UC's for 2 hours 

## 2012-09-25 NOTE — MAU Note (Signed)
Contractions for about 2 hrs. For repeat C/S next Weds. No bleeding.

## 2012-09-25 NOTE — H&P (Signed)
Jacqueline Carter is a 31 y.o. female presenting for contractions Pt presents to [facility] complaining of contractions.  Initially her cervix was fingertip and posterior and difficult to reach.  She received procardia and was rechecked an hour later and her cervix had dilated to 1/50/-2.  Given the cervical change will proceed with repeat cesarean section. Her pregnancy has been complicated by gestational diabetes History OB History    Grav Para Term Preterm Abortions TAB SAB Ect Mult Living   4 3 3       3      Past Medical History  Diagnosis Date  . Diabetes mellitus   . Complication of anesthesia     required re-dosing of epidural with 2008 delivery  . PONV (postoperative nausea and vomiting)   . Gestational diabetes    Past Surgical History  Procedure Date  . Cesarean section    Family History: family history is negative for Other. Social History:  reports that she has never smoked. She does not have any smokeless tobacco history on file. She reports that she does not drink alcohol or use illicit drugs.   Prenatal Transfer Tool  Maternal Diabetes: Yes:  Diabetes Type:  Diet controlled Genetic Screening: Normal Maternal Ultrasounds/Referrals: Normal Fetal Ultrasounds or other Referrals:  None Maternal Substance Abuse:  No Significant Maternal Medications:  None Significant Maternal Lab Results:  None Other Comments:  None  ROS: As above  Dilation: 1 Effacement (%): 50 Exam by:: B Mosca Blood pressure 109/70, pulse 91, temperature 98 F (36.7 C), resp. rate 20, height 5\' 2"  (1.575 m), weight 95.437 kg (210 lb 6.4 oz), last menstrual period 12/31/2011. Exam Physical Exam  Prenatal labs: ABO, Rh: --/--/A POS (10/30 1610) Antibody: NEG (10/30 0854) Rubella: Immune (04/30 0000) RPR: NON REACTIVE (10/30 0853)  HBsAg: Negative (04/30 0000)  HIV: Non-reactive (04/30 0000)  GBS:    Pt Sickle Cell trait +, FOB negative Assessment/Plan: 1) Admit 2) Proceed with repeat cesarean  section.  R/B/A reviewed with patient and she wishes to proceed  [name] H. 09/25/2012, 11:39 PM

## 2012-09-25 NOTE — Anesthesia Preprocedure Evaluation (Signed)
Anesthesia Evaluation  Patient identified by MRN, date of birth, ID band Patient awake    Reviewed: Allergy & Precautions, H&P , NPO status , Patient's Chart, lab work & pertinent test results  History of Anesthesia Complications (+) PONV  Airway Mallampati: III TM Distance: >3 FB Neck ROM: Full    Dental No notable dental hx. (+) Teeth Intact   Pulmonary neg pulmonary ROS,  breath sounds clear to auscultation  Pulmonary exam normal       Cardiovascular negative cardio ROS  Rhythm:Regular Rate:Normal     Neuro/Psych negative neurological ROS  negative psych ROS   GI/Hepatic negative GI ROS, Neg liver ROS,   Endo/Other  diabetes, Well Controlled, GestationalMorbid obesity  Renal/GU negative Renal ROS  negative genitourinary   Musculoskeletal negative musculoskeletal ROS (+)   Abdominal (+) + obese,   Peds  Hematology negative hematology ROS (+)   Anesthesia Other Findings   Reproductive/Obstetrics Previous C/Section x 2 In labor                           Anesthesia Physical Anesthesia Plan  ASA: III and Emergent  Anesthesia Plan: Spinal   Post-op Pain Management:    Induction:   Airway Management Planned: Natural Airway  Additional Equipment:   Intra-op Plan:   Post-operative Plan:   Informed Consent: I have reviewed the patients History and Physical, chart, labs and discussed the procedure including the risks, benefits and alternatives for the proposed anesthesia with the patient or authorized representative who has indicated his/her understanding and acceptance.   Dental advisory given  Plan Discussed with: CRNA, Anesthesiologist and Surgeon  Anesthesia Plan Comments:         Anesthesia Quick Evaluation

## 2012-09-26 ENCOUNTER — Encounter (HOSPITAL_COMMUNITY): Payer: Self-pay | Admitting: *Deleted

## 2012-09-26 LAB — CBC
HCT: 34 % — ABNORMAL LOW (ref 36.0–46.0)
HCT: 31.9 % — ABNORMAL LOW (ref 36.0–46.0)
Hemoglobin: 11.9 g/dL — ABNORMAL LOW (ref 12.0–15.0)
Hemoglobin: 11 g/dL — ABNORMAL LOW (ref 12.0–15.0)
MCH: 30.8 pg (ref 26.0–34.0)
MCH: 30.4 pg (ref 26.0–34.0)
MCHC: 35 g/dL (ref 30.0–36.0)
MCHC: 34.5 g/dL (ref 30.0–36.0)
MCV: 88.1 fL (ref 78.0–100.0)
MCV: 88.1 fL (ref 78.0–100.0)
Platelets: 232 10*3/uL (ref 150–400)
Platelets: 193 10*3/uL (ref 150–400)
RBC: 3.86 MIL/uL — ABNORMAL LOW (ref 3.87–5.11)
RBC: 3.62 MIL/uL — ABNORMAL LOW (ref 3.87–5.11)
RDW: 13.5 % (ref 11.5–15.5)
RDW: 13.2 % (ref 11.5–15.5)
WBC: 10.6 10*3/uL — ABNORMAL HIGH (ref 4.0–10.5)
WBC: 15.5 10*3/uL — ABNORMAL HIGH (ref 4.0–10.5)

## 2012-09-26 LAB — GLUCOSE, CAPILLARY
Glucose-Capillary: 109 mg/dL — ABNORMAL HIGH (ref 70–99)
Glucose-Capillary: 93 mg/dL (ref 70–99)

## 2012-09-26 LAB — PREPARE RBC (CROSSMATCH)

## 2012-09-26 LAB — RPR: RPR Ser Ql: NONREACTIVE

## 2012-09-26 MED ORDER — SIMETHICONE 80 MG PO CHEW: 80.0000 mg | CHEWABLE_TABLET | ORAL | Status: DC | PRN

## 2012-09-26 MED ORDER — OXYCODONE-ACETAMINOPHEN 5-325 MG PO TABS
1.0000 | ORAL_TABLET | ORAL | Status: DC | PRN
  Administered 2012-09-26: 2 via ORAL
  Administered 2012-09-26 (×2): 1 via ORAL
  Administered 2012-09-27 (×2): 2 via ORAL
  Administered 2012-09-27 – 2012-09-28 (×2): 1 via ORAL
  Filled 2012-09-26: qty 2
  Filled 2012-09-26: qty 1
  Filled 2012-09-26: qty 2
  Filled 2012-09-26 (×2): qty 1
  Filled 2012-09-26: qty 2
  Filled 2012-09-26: qty 1

## 2012-09-26 MED ORDER — SCOPOLAMINE 1 MG/3DAYS TD PT72
MEDICATED_PATCH | TRANSDERMAL | Status: AC
  Filled 2012-09-26: qty 1

## 2012-09-26 MED ORDER — SODIUM CHLORIDE 0.9 % IJ SOLN: 3.0000 mL | INTRAMUSCULAR | Status: DC | PRN

## 2012-09-26 MED ORDER — PHENYLEPHRINE 40 MCG/ML (10ML) SYRINGE FOR IV PUSH (FOR BLOOD PRESSURE SUPPORT)
PREFILLED_SYRINGE | INTRAVENOUS | Status: AC
  Filled 2012-09-26: qty 5

## 2012-09-26 MED ORDER — NALOXONE HCL 0.4 MG/ML IJ SOLN: 0.4000 mg | INTRAMUSCULAR | Status: DC | PRN

## 2012-09-26 MED ORDER — METOCLOPRAMIDE HCL 5 MG/ML IJ SOLN
INTRAMUSCULAR | Status: AC
  Filled 2012-09-26: qty 2

## 2012-09-26 MED ORDER — CEFAZOLIN SODIUM-DEXTROSE 2-3 GM-% IV SOLR
2.0000 g | INTRAVENOUS | Status: DC
  Filled 2012-09-26: qty 50

## 2012-09-26 MED ORDER — METOCLOPRAMIDE HCL 5 MG/ML IJ SOLN
10.0000 mg | Freq: Three times a day (TID) | INTRAMUSCULAR | Status: DC | PRN
  Administered 2012-09-26: 10 mg via INTRAVENOUS

## 2012-09-26 MED ORDER — DIPHENHYDRAMINE HCL 50 MG/ML IJ SOLN: 25.0000 mg | INTRAMUSCULAR | Status: DC | PRN

## 2012-09-26 MED ORDER — DIBUCAINE 1 % RE OINT: 1.0000 "application " | TOPICAL_OINTMENT | RECTAL | Status: DC | PRN

## 2012-09-26 MED ORDER — MEPERIDINE HCL 25 MG/ML IJ SOLN: 6.2500 mg | INTRAMUSCULAR | Status: DC | PRN

## 2012-09-26 MED ORDER — ONDANSETRON HCL 4 MG PO TABS
4.0000 mg | ORAL_TABLET | ORAL | Status: DC | PRN
Start: 2012-09-26 — End: 2012-09-28

## 2012-09-26 MED ORDER — LACTATED RINGERS IV SOLN
INTRAVENOUS | Status: DC
  Administered 2012-09-26 (×3): via INTRAVENOUS

## 2012-09-26 MED ORDER — IBUPROFEN 600 MG PO TABS
600.0000 mg | ORAL_TABLET | Freq: Four times a day (QID) | ORAL | Status: DC
  Administered 2012-09-26 – 2012-09-28 (×8): 600 mg via ORAL
  Filled 2012-09-26 (×8): qty 1

## 2012-09-26 MED ORDER — 0.9 % SODIUM CHLORIDE (POUR BTL) OPTIME
TOPICAL | Status: DC | PRN
  Administered 2012-09-26: 500 mL

## 2012-09-26 MED ORDER — HYDROMORPHONE HCL PF 1 MG/ML IJ SOLN
1.0000 mg | Freq: Once | INTRAMUSCULAR | Status: AC
  Administered 2012-09-26: 1 mg via INTRAVENOUS
  Filled 2012-09-26: qty 1

## 2012-09-26 MED ORDER — KETOROLAC TROMETHAMINE 30 MG/ML IJ SOLN: 30.0000 mg | Freq: Four times a day (QID) | INTRAMUSCULAR | Status: AC | PRN

## 2012-09-26 MED ORDER — SIMETHICONE 80 MG PO CHEW
80.0000 mg | CHEWABLE_TABLET | Freq: Three times a day (TID) | ORAL | Status: DC
  Administered 2012-09-26 – 2012-09-28 (×9): 80 mg via ORAL

## 2012-09-26 MED ORDER — NALBUPHINE SYRINGE 5 MG/0.5 ML
5.0000 mg | INJECTION | INTRAMUSCULAR | Status: DC | PRN
  Filled 2012-09-26: qty 1

## 2012-09-26 MED ORDER — ONDANSETRON HCL 4 MG/2ML IJ SOLN
4.0000 mg | INTRAMUSCULAR | Status: DC | PRN
  Administered 2012-09-26: 4 mg via INTRAVENOUS
  Filled 2012-09-26: qty 2

## 2012-09-26 MED ORDER — DIPHENHYDRAMINE HCL 50 MG/ML IJ SOLN: 12.5000 mg | INTRAMUSCULAR | Status: DC | PRN

## 2012-09-26 MED ORDER — ZOLPIDEM TARTRATE 5 MG PO TABS
5.0000 mg | ORAL_TABLET | Freq: Every evening | ORAL | Status: DC | PRN
Start: 2012-09-26 — End: 2012-09-28

## 2012-09-26 MED ORDER — SENNOSIDES-DOCUSATE SODIUM 8.6-50 MG PO TABS
2.0000 | ORAL_TABLET | Freq: Every day | ORAL | Status: DC
  Administered 2012-09-26 – 2012-09-27 (×2): 2 via ORAL

## 2012-09-26 MED ORDER — PRENATAL MULTIVITAMIN CH
1.0000 | ORAL_TABLET | Freq: Every day | ORAL | Status: DC
  Administered 2012-09-26 – 2012-09-28 (×3): 1 via ORAL
  Filled 2012-09-26 (×3): qty 1

## 2012-09-26 MED ORDER — OXYTOCIN 40 UNITS IN LACTATED RINGERS INFUSION - SIMPLE MED: 62.5000 mL/h | INTRAVENOUS | Status: AC

## 2012-09-26 MED ORDER — SCOPOLAMINE 1 MG/3DAYS TD PT72
1.0000 | MEDICATED_PATCH | Freq: Once | TRANSDERMAL | Status: DC
  Administered 2012-09-26: 1.5 mg via TRANSDERMAL
  Filled 2012-09-26: qty 1

## 2012-09-26 MED ORDER — KETOROLAC TROMETHAMINE 30 MG/ML IJ SOLN
INTRAMUSCULAR | Status: AC
  Administered 2012-09-26: 30 mg via INTRAVENOUS
  Filled 2012-09-26: qty 1

## 2012-09-26 MED ORDER — KETOROLAC TROMETHAMINE 30 MG/ML IJ SOLN
30.0000 mg | Freq: Four times a day (QID) | INTRAMUSCULAR | Status: AC | PRN
  Administered 2012-09-26 (×2): 30 mg via INTRAVENOUS
  Filled 2012-09-26: qty 1

## 2012-09-26 MED ORDER — MENTHOL 3 MG MT LOZG: 1.0000 | LOZENGE | OROMUCOSAL | Status: DC | PRN

## 2012-09-26 MED ORDER — ONDANSETRON HCL 4 MG/2ML IJ SOLN: 4.0000 mg | Freq: Three times a day (TID) | INTRAMUSCULAR | Status: DC | PRN

## 2012-09-26 MED ORDER — LANOLIN HYDROUS EX OINT: 1.0000 "application " | TOPICAL_OINTMENT | CUTANEOUS | Status: DC | PRN

## 2012-09-26 MED ORDER — DIPHENHYDRAMINE HCL 25 MG PO CAPS: 25.0000 mg | ORAL_CAPSULE | ORAL | Status: DC | PRN

## 2012-09-26 MED ORDER — SODIUM CHLORIDE 0.9 % IV SOLN
1.0000 ug/kg/h | INTRAVENOUS | Status: DC | PRN
  Filled 2012-09-26: qty 2.5

## 2012-09-26 MED ORDER — TETANUS-DIPHTH-ACELL PERTUSSIS 5-2.5-18.5 LF-MCG/0.5 IM SUSP: 0.5000 mL | Freq: Once | INTRAMUSCULAR | Status: DC

## 2012-09-26 MED ORDER — FAMOTIDINE IN NACL 20-0.9 MG/50ML-% IV SOLN: 20.0000 mg | Freq: Once | INTRAVENOUS | Status: DC

## 2012-09-26 MED ORDER — IBUPROFEN 600 MG PO TABS: 600.0000 mg | ORAL_TABLET | Freq: Four times a day (QID) | ORAL | Status: DC | PRN

## 2012-09-26 MED ORDER — DIPHENHYDRAMINE HCL 25 MG PO CAPS: 25.0000 mg | ORAL_CAPSULE | Freq: Four times a day (QID) | ORAL | Status: DC | PRN

## 2012-09-26 MED ORDER — WITCH HAZEL-GLYCERIN EX PADS: 1.0000 "application " | MEDICATED_PAD | CUTANEOUS | Status: DC | PRN

## 2012-09-26 NOTE — Op Note (Signed)
Pre-Operative Diagnosis: 1) 38+4 week intrauterine pregnancy 2) labor 3) History of 2 prior cesarean sections Postoperative Diagnosis: Same Procedure: Repeat low transverse cesarean section  Surgeon: Dr. Waynard Reeds Assistant: None Operative Findings: Vigorous female infant in the vertex presentation. Delivery of the infant was assisted with the vacuum with pop off x 1. Lower uterine segment thin.  Specimen: Placenta for disposal EBL:  EBL 800, UOP 200 IVF 2300  Procedure:[name] is an 31 year old gravida 4 para 3003 at 83 weeks and 4 days estimated gestational age who presents for cesarean section. The patient presented to [facility]U complaining of contractions and changed her cervix therefore the decision was made to proceed with repeat cesarean section. Following the appropriate informed consent the patient was brought to the operating room where spinal anesthesia was administered and found to be adequate. She was placed in the dorsal supine position with a leftward tilt. She was prepped and draped in the normal sterile fashion. Scalpel was then used to make a Pfannenstiel skin incision which was carried down to the underlying layers of soft tissue to the fascia. The fascia was incised in the midline and the fascial incision was extended laterally with Mayo scissors. The superior aspect of the fascial incision was grasped with Coker clamps x2, tented up and the rectus muscles dissected off sharply with the electrocautery unit area and the same procedure was repeated on the inferior aspect of the fascial incision. The rectus muscles were separated in the midline. The abdominal peritoneum was identified, tented up, entered sharply, and the incision was extended superiorly and inferiorly with good visualization of the bladder. The Alexis retractor was then deployed. The vesicouterine peritoneum was identified, tented up, entered sharply, and the bladder flap was created digitally. Scalpel was then used to make a  low transverse incision on the uterus which was extended laterally with both blunt dissection and the bandage scissors. The fetal vertex was identified, delivered easily through the uterine incision with the assistance of the vacuum with pop off x 1 followed by the body. The infant was bulb suctioned on the operative field cried vigorously, cord was clamped and cut and the infant was passed to the waiting neonatologist. Placenta was then delivered spontaneously, the uterus was cleared of all clot and debris. The uterine incision was repaired with #1 chromic in running locked fashion followed by a second imbricating layer. The Alexis retractor was removed. The uterus was returned to the abdominal cavity the abdominal cavity was cleared of all clot and debris. The abdominal peritoneum was reapproximated with 2-0 Vicryl in a running fashion, the rectus muscles was reapproximated with #1 chromic in a running fashion. The fascia was closed with a looped PDS in a running fashion. The skin was closed with 4-0 vicryl in a subcuticular fashion and Dermabond. All sponge lap and needle counts were correct x2. Patient tolerated the procedure well and recovered in stable condition following the procedure.

## 2012-09-26 NOTE — Transfer of Care (Signed)
Immediate Anesthesia Transfer of Care Note  Patient: Jacqueline Carter  Procedure(s) Performed: Procedure(s) (LRB) with comments: CESAREAN SECTION (N/A) - Repeat cesarean section of baby boy  at 0102  APGAR 9/9  Patient Location: PACU  Anesthesia Type:Spinal  Level of Consciousness: awake, alert  and oriented  Airway & Oxygen Therapy: Patient Spontanous Breathing  Post-op Assessment: Report given to PACU RN and Post -op Vital signs reviewed and stable  Post vital signs: stable  Complications: No apparent anesthesia complications 

## 2012-09-26 NOTE — Anesthesia Postprocedure Evaluation (Signed)
  Anesthesia Post-op Note  Patient: Jacqueline Carter  Procedure(s) Performed: Procedure(s) (LRB) with comments: CESAREAN SECTION (N/A) - Repeat cesarean section of baby boy  at 0102  APGAR 9/9  Patient Location: PACU and Mother/Baby  Anesthesia Type:Spinal  Level of Consciousness: awake, alert  and oriented  Airway and Oxygen Therapy: Patient Spontanous Breathing  Post-op Pain: mild  Post-op Assessment: Post-op Vital signs reviewed, Patient's Cardiovascular Status Stable, Respiratory Function Stable, No signs of Nausea or vomiting, Adequate PO intake and Pain level controlled  Post-op Vital Signs: stable  Complications: No apparent anesthesia complications

## 2012-09-26 NOTE — OR Nursing (Signed)
100 ml blood loss during fundal massage by DLWegner RN 

## 2012-09-26 NOTE — Progress Notes (Signed)
Subjective: Postpartum Day 0: Cesarean Delivery Patient reports pain controlled.  Bleeding appropriate. No nausea and vomiting  Objective: Vital signs in last 24 hours: Temp:  [97.5 F (36.4 C)-98.4 F (36.9 C)] 97.9 F (36.6 C) (11/03 0923) Pulse Rate:  [75-101] 75  (11/03 0923) Resp:  [16-22] 20  (11/03 0923) BP: (105-129)/(53-75) 105/64 mmHg (11/03 0923) SpO2:  [92 %-100 %] 96 % (11/03 0923) Weight:  [95.437 kg (210 lb 6.4 oz)] 95.437 kg (210 lb 6.4 oz) (11/02 2058)  Physical Exam:  General: alert, cooperative and appears stated age Lochia: appropriate Uterine Fundus: firm Incision: healing well DVT Evaluation: No evidence of DVT seen on physical exam.   Basename 09/26/12 0643 09/25/12 2345  HGB 11.0* 11.9*  HCT 31.9* 34.0*    Assessment/Plan: Status post Cesarean section. Doing well postoperatively.  Continue current care. Desires neonatal circ.  Needs to pay hospital circ fee.  Baby needs exam.  Will likely postpone until tomorrow  Joven Mom H. 09/26/2012, 9:35 AM

## 2012-09-26 NOTE — Transfer of Care (Signed)
Immediate Anesthesia Transfer of Care Note  Patient: Jacqueline Carter  Procedure(s) Performed: Procedure(s) (LRB) with comments: CESAREAN SECTION (N/A) - Repeat cesarean section of baby boy  at 0102  APGAR 9/9  Patient Location: PACU  Anesthesia Type:Spinal  Level of Consciousness: awake, alert  and oriented  Airway & Oxygen Therapy: Patient Spontanous Breathing  Post-op Assessment: Report given to PACU RN and Post -op Vital signs reviewed and stable  Post vital signs: stable  Complications: No apparent anesthesia complications

## 2012-09-26 NOTE — Anesthesia Postprocedure Evaluation (Signed)
  Anesthesia Post-op Note  Patient: Jacqueline Carter  Procedure(s) Performed: Procedure(s) (LRB) with comments: CESAREAN SECTION (N/A) - Repeat cesarean section of baby boy  at 0102  APGAR 9/9  Patient Location: PACU  Anesthesia Type:Spinal  Level of Consciousness: awake, alert  and oriented  Airway and Oxygen Therapy: Patient Spontanous Breathing  Post-op Pain: none  Post-op Assessment: Post-op Vital signs reviewed, Patient's Cardiovascular Status Stable, Respiratory Function Stable, Patent Airway, No signs of Nausea or vomiting, Pain level controlled, No headache, No backache and No residual numbness  Post-op Vital Signs: Reviewed and stable  Complications: No apparent anesthesia complications

## 2012-09-27 ENCOUNTER — Encounter (HOSPITAL_COMMUNITY): Payer: Self-pay | Admitting: Obstetrics and Gynecology

## 2012-09-27 MED ORDER — LIDOCAINE 1%/NA BICARB 0.1 MEQ INJECTION
0.8000 mL | INJECTION | Freq: Once | INTRAVENOUS | Status: DC
  Filled 2012-09-27: qty 1

## 2012-09-27 MED ORDER — BISACODYL 10 MG RE SUPP
10.0000 mg | Freq: Once | RECTAL | Status: AC
  Administered 2012-09-27: 10 mg via RECTAL
  Filled 2012-09-27: qty 1

## 2012-09-27 MED ORDER — EPINEPHRINE TOPICAL FOR CIRCUMCISION 0.1 MG/ML
1.0000 [drp] | TOPICAL | Status: DC | PRN
  Filled 2012-09-27: qty 0.05

## 2012-09-27 MED ORDER — ACETAMINOPHEN FOR CIRCUMCISION 160 MG/5 ML
40.0000 mg | ORAL | Status: DC | PRN
  Filled 2012-09-27: qty 2.5

## 2012-09-27 MED ORDER — ACETAMINOPHEN FOR CIRCUMCISION 160 MG/5 ML
40.0000 mg | Freq: Once | ORAL | Status: DC
  Filled 2012-09-27: qty 2.5

## 2012-09-27 MED ORDER — SUCROSE 24% NICU/PEDS ORAL SOLUTION
0.5000 mL | OROMUCOSAL | Status: DC
  Filled 2012-09-27 (×2): qty 0.5

## 2012-09-27 NOTE — Progress Notes (Signed)
Patient c/o a lot of gas pains and requests something to help move bowels. Dr. Arlyce Dice called and order received. Will continue to monitor patient.

## 2012-09-27 NOTE — Progress Notes (Signed)
Post Op Day 1 Subjective: no complaints  Objective: Blood pressure 101/68, pulse 75, temperature 98.3 F (36.8 C), temperature source Oral, resp. rate 20, height 5\' 2"  (1.575 m), weight 210 lb 6.4 oz (95.437 kg), last menstrual period 12/31/2011, SpO2 95.00%, unknown if currently breastfeeding.  Physical Exam:  General: alert Lochia: appropriate Uterine Fundus: firm Incision: healing well   Basename 09/26/12 0643 09/25/12 2345  HGB 11.0* 11.9*  HCT 31.9* 34.0*    Assessment/Plan: Plan for discharge tomorrow   LOS: 2 days   Karlee Staff D 09/27/2012, 9:38 AM

## 2012-09-27 NOTE — Progress Notes (Signed)
Ur chart review completed.  

## 2012-09-28 MED ORDER — OXYCODONE-ACETAMINOPHEN 5-325 MG PO TABS: 1.0000 | ORAL_TABLET | ORAL | Status: DC | PRN

## 2012-09-28 MED ORDER — IBUPROFEN 600 MG PO TABS: 600.0000 mg | ORAL_TABLET | Freq: Four times a day (QID) | ORAL | Status: DC

## 2012-09-28 NOTE — Discharge Summary (Signed)
Obstetric Discharge Summary Reason for Admission: onset of labor Prenatal Procedures: ultrasound Intrapartum Procedures: Repeat Cesarean Section Postpartum Procedures: none Complications-Operative and Postpartum: none Hemoglobin  Date Value Range Status  09/26/2012 11.0* 12.0 - 15.0 g/dL Final     HCT  Date Value Range Status  09/26/2012 31.9* 36.0 - 46.0 % Final    Physical Exam:  General: alert Lochia: appropriate Uterine Fundus: firm Incision: healing well DVT Evaluation: No evidence of DVT seen on physical exam.  Discharge Diagnoses: Term Pregnancy-delivered  Discharge Information: Date: 09/28/2012 Activity: pelvic rest Diet: routine Medications: PNV, Ibuprofen and Percocet Condition: stable Instructions: refer to practice specific booklet Discharge to: home Follow-up Information    Follow up with Almon Hercules., MD. Schedule an appointment as soon as possible for a visit in 4 weeks.   Contact information:   [address] [place] [address] [phone]          Newborn Data: Live born female  Birth Weight: 7 lb 14 oz (3572 g) APGAR: 9, 9  Home with mother.  Tori Cupps E 09/28/2012, 8:26 AM

## 2012-09-28 NOTE — Progress Notes (Signed)
POD#2 Pt without complaints. Would like to go home. VSSAF IMP/stable PLan/ will discharge.

## 2012-09-29 ENCOUNTER — Inpatient Hospital Stay (HOSPITAL_COMMUNITY)
Admission: RE | Admit: 2012-09-29 | Payer: Medicaid Other | Source: Ambulatory Visit | Admitting: Obstetrics & Gynecology

## 2012-09-29 ENCOUNTER — Encounter (HOSPITAL_COMMUNITY): Admission: RE | Payer: Self-pay | Source: Ambulatory Visit

## 2012-09-29 LAB — TYPE AND SCREEN
ABO/RH(D): A POS
Antibody Screen: NEGATIVE
Unit division: 0
Unit division: 0

## 2012-09-29 SURGERY — Surgical Case
Anesthesia: Regional

## 2012-10-07 MED FILL — Morphine Sulfate Inj PF 0.5 MG/ML: INTRAMUSCULAR | Qty: 10 | Status: AC

## 2012-10-07 MED FILL — Oxytocin-Lactated Ringers IV Soln 40 Unit/1000ML: INTRAVENOUS | Qty: 1000 | Status: AC

## 2012-10-07 MED FILL — Bupivacaine HCl Preservative Free (PF) Inj 0.25%: INTRAMUSCULAR | Qty: 30 | Status: AC

## 2012-10-07 MED FILL — Fentanyl Citrate Inj 0.05 MG/ML: INTRAMUSCULAR | Qty: 0.5 | Status: AC

## 2012-10-07 MED FILL — Phenylephrine-NaCl Pref Syr 0.4 MG/10ML-0.9% (40 MCG/ML): INTRAVENOUS | Qty: 20 | Status: AC

## 2014-01-15 ENCOUNTER — Emergency Department (HOSPITAL_COMMUNITY)
Admission: EM | Admit: 2014-01-15 | Discharge: 2014-01-15 | Disposition: A | Discharge status: Home / Self Care | Payer: Self-pay | Attending: Emergency Medicine | Admitting: Emergency Medicine

## 2014-01-15 ENCOUNTER — Encounter (HOSPITAL_COMMUNITY): Payer: Self-pay | Admitting: Emergency Medicine

## 2014-01-15 DIAGNOSIS — Z3202 Encounter for pregnancy test, result negative: Secondary | ICD-10-CM | POA: Insufficient documentation

## 2014-01-15 DIAGNOSIS — R42 Dizziness and giddiness: Secondary | ICD-10-CM | POA: Insufficient documentation

## 2014-01-15 DIAGNOSIS — Z975 Presence of (intrauterine) contraceptive device: Secondary | ICD-10-CM | POA: Insufficient documentation

## 2014-01-15 DIAGNOSIS — A499 Bacterial infection, unspecified: Secondary | ICD-10-CM | POA: Insufficient documentation

## 2014-01-15 DIAGNOSIS — B9689 Other specified bacterial agents as the cause of diseases classified elsewhere: Secondary | ICD-10-CM | POA: Insufficient documentation

## 2014-01-15 DIAGNOSIS — R1032 Left lower quadrant pain: Secondary | ICD-10-CM

## 2014-01-15 DIAGNOSIS — R1031 Right lower quadrant pain: Secondary | ICD-10-CM

## 2014-01-15 DIAGNOSIS — R112 Nausea with vomiting, unspecified: Secondary | ICD-10-CM | POA: Insufficient documentation

## 2014-01-15 DIAGNOSIS — E119 Type 2 diabetes mellitus without complications: Secondary | ICD-10-CM | POA: Insufficient documentation

## 2014-01-15 DIAGNOSIS — N760 Acute vaginitis: Secondary | ICD-10-CM | POA: Insufficient documentation

## 2014-01-15 DIAGNOSIS — Z79899 Other long term (current) drug therapy: Secondary | ICD-10-CM | POA: Insufficient documentation

## 2014-01-15 DIAGNOSIS — R209 Unspecified disturbances of skin sensation: Secondary | ICD-10-CM | POA: Insufficient documentation

## 2014-01-15 DIAGNOSIS — E876 Hypokalemia: Secondary | ICD-10-CM | POA: Insufficient documentation

## 2014-01-15 LAB — URINALYSIS, ROUTINE W REFLEX MICROSCOPIC
Bilirubin Urine: NEGATIVE
Glucose, UA: NEGATIVE mg/dL
Ketones, ur: NEGATIVE mg/dL
Leukocytes, UA: NEGATIVE
Nitrite: NEGATIVE
Protein, ur: NEGATIVE mg/dL
Specific Gravity, Urine: 1.008 (ref 1.005–1.030)
Urobilinogen, UA: 1 mg/dL (ref 0.0–1.0)
pH: 6 (ref 5.0–8.0)

## 2014-01-15 LAB — WET PREP, GENITAL
Trich, Wet Prep: NONE SEEN
WBC, Wet Prep HPF POC: NONE SEEN
Yeast Wet Prep HPF POC: NONE SEEN

## 2014-01-15 LAB — BASIC METABOLIC PANEL
BUN: 8 mg/dL (ref 6–23)
CO2: 20 mEq/L (ref 19–32)
Calcium: 9.3 mg/dL (ref 8.4–10.5)
Chloride: 98 mEq/L (ref 96–112)
Creatinine, Ser: 0.79 mg/dL (ref 0.50–1.10)
GFR calc Af Amer: >90 mL/min (ref >90)
GFR calc non Af Amer: >90 mL/min (ref >90)
Glucose, Bld: 128 mg/dL — ABNORMAL HIGH (ref 70–99)
Potassium: 3.1 mEq/L — ABNORMAL LOW (ref 3.7–5.3)
Sodium: 137 mEq/L (ref 137–147)

## 2014-01-15 LAB — URINE MICROSCOPIC-ADD ON

## 2014-01-15 LAB — CBC WITH DIFFERENTIAL/PLATELET
Basophils Absolute: 0 10*3/uL (ref 0.0–0.1)
Basophils Relative: 0 % (ref 0–1)
Eosinophils Absolute: 0 10*3/uL (ref 0.0–0.7)
Eosinophils Relative: 0 % (ref 0–5)
HCT: 37.1 % (ref 36.0–46.0)
Hemoglobin: 13 g/dL (ref 12.0–15.0)
Lymphocytes Relative: 7 % — ABNORMAL LOW (ref 12–46)
Lymphs Abs: 0.8 10*3/uL (ref 0.7–4.0)
MCH: 29.5 pg (ref 26.0–34.0)
MCHC: 35 g/dL (ref 30.0–36.0)
MCV: 84.3 fL (ref 78.0–100.0)
Monocytes Absolute: 0.5 10*3/uL (ref 0.1–1.0)
Monocytes Relative: 4 % (ref 3–12)
Neutro Abs: 11.2 10*3/uL — ABNORMAL HIGH (ref 1.7–7.7)
Neutrophils Relative %: 89 % — ABNORMAL HIGH (ref 43–77)
Platelets: 299 10*3/uL (ref 150–400)
RBC: 4.4 MIL/uL (ref 3.87–5.11)
RDW: 12.1 % (ref 11.5–15.5)
WBC: 12.6 10*3/uL — ABNORMAL HIGH (ref 4.0–10.5)

## 2014-01-15 LAB — PREGNANCY, URINE: Preg Test, Ur: NEGATIVE

## 2014-01-15 LAB — LIPASE, BLOOD: Lipase: 23 U/L (ref 11–59)

## 2014-01-15 MED ORDER — ONDANSETRON 4 MG PO TBDP
8.0000 mg | ORAL_TABLET | Freq: Once | ORAL | Status: AC
  Administered 2014-01-15: 8 mg via ORAL
  Filled 2014-01-15: qty 2

## 2014-01-15 MED ORDER — POTASSIUM CHLORIDE CRYS ER 20 MEQ PO TBCR
40.0000 meq | EXTENDED_RELEASE_TABLET | Freq: Once | ORAL | Status: AC
  Administered 2014-01-15: 40 meq via ORAL
  Filled 2014-01-15: qty 2

## 2014-01-15 MED ORDER — METRONIDAZOLE 500 MG PO TABS
500.0000 mg | ORAL_TABLET | Freq: Two times a day (BID) | ORAL | Status: DC
Start: 2014-01-15 — End: 2018-02-14

## 2014-01-15 MED ORDER — PROMETHAZINE HCL 25 MG PO TABS: 25.0000 mg | ORAL_TABLET | Freq: Four times a day (QID) | ORAL | Status: DC | PRN

## 2014-01-15 MED ORDER — TRAMADOL HCL 50 MG PO TABS: 50.0000 mg | ORAL_TABLET | Freq: Four times a day (QID) | ORAL | Status: DC | PRN

## 2014-01-15 MED ORDER — SODIUM CHLORIDE 0.9 % IV BOLUS (SEPSIS)
1000.0000 mL | Freq: Once | INTRAVENOUS | Status: AC
  Administered 2014-01-15: 1000 mL via INTRAVENOUS

## 2014-01-15 MED ORDER — ONDANSETRON HCL 4 MG/2ML IJ SOLN
4.0000 mg | Freq: Once | INTRAMUSCULAR | Status: AC
  Administered 2014-01-15: 4 mg via INTRAVENOUS
  Filled 2014-01-15: qty 2

## 2014-01-15 MED ORDER — METRONIDAZOLE 500 MG PO TABS
500.0000 mg | ORAL_TABLET | Freq: Once | ORAL | Status: AC
  Administered 2014-01-15: 500 mg via ORAL
  Filled 2014-01-15: qty 1

## 2014-01-15 MED ORDER — MORPHINE SULFATE 4 MG/ML IJ SOLN
4.0000 mg | Freq: Once | INTRAMUSCULAR | Status: AC
  Administered 2014-01-15: 4 mg via INTRAVENOUS
  Filled 2014-01-15: qty 1

## 2014-01-15 NOTE — Discharge Instructions (Signed)
Please take flagyl as treatment for bacterial vaginosis.  Take pain medication and antinausea medication as needed.  Return to ER if you are having fever, worsening abdominal pain, persistent vomiting.  Return if you have any other concerns.   Abdominal Pain, Women Abdominal (stomach, pelvic, or belly) pain can be caused by many things. It is important to tell your doctor:  The location of the pain.  Does it come and go or is it present all the time?  Are there things that start the pain (eating certain foods, exercise)?  Are there other symptoms associated with the pain (fever, nausea, vomiting, diarrhea)? All of this is helpful to know when trying to find the cause of the pain. CAUSES   Stomach: virus or bacteria infection, or ulcer.  Intestine: appendicitis (inflamed appendix), regional ileitis (Crohn's disease), ulcerative colitis (inflamed colon), irritable bowel syndrome, diverticulitis (inflamed diverticulum of the colon), or cancer of the stomach or intestine.  Gallbladder disease or stones in the gallbladder.  Kidney disease, kidney stones, or infection.  Pancreas infection or cancer.  Fibromyalgia (pain disorder).  Diseases of the female organs:  Uterus: fibroid (non-cancerous) tumors or infection.  Fallopian tubes: infection or tubal pregnancy.  Ovary: cysts or tumors.  Pelvic adhesions (scar tissue).  Endometriosis (uterus lining tissue growing in the pelvis and on the pelvic organs).  Pelvic congestion syndrome (female organs filling up with blood just before the menstrual period).  Pain with the menstrual period.  Pain with ovulation (producing an egg).  Pain with an IUD (intrauterine device, birth control) in the uterus.  Cancer of the female organs.  Functional pain (pain not caused by a disease, may improve without treatment).  Psychological pain.  Depression. DIAGNOSIS  Your doctor will decide the seriousness of your pain by doing an  examination.  Blood tests.  X-rays.  Ultrasound.  CT scan (computed tomography, special type of X-ray).  MRI (magnetic resonance imaging).  Cultures, for infection.  Barium enema (dye inserted in the large intestine, to better view it with X-rays).  Colonoscopy (looking in intestine with a lighted tube).  Laparoscopy (minor surgery, looking in abdomen with a lighted tube).  Major abdominal exploratory surgery (looking in abdomen with a large incision). TREATMENT  The treatment will depend on the cause of the pain.   Many cases can be observed and treated at home.  Over-the-counter medicines recommended by your caregiver.  Prescription medicine.  Antibiotics, for infection.  Birth control pills, for painful periods or for ovulation pain.  Hormone treatment, for endometriosis.  Nerve blocking injections.  Physical therapy.  Antidepressants.  Counseling with a psychologist or psychiatrist.  Minor or major surgery. HOME CARE INSTRUCTIONS   Do not take laxatives, unless directed by your caregiver.  Take over-the-counter pain medicine only if ordered by your caregiver. Do not take aspirin because it can cause an upset stomach or bleeding.  Try a clear liquid diet (broth or water) as ordered by your caregiver. Slowly move to a bland diet, as tolerated, if the pain is related to the stomach or intestine.  Have a thermometer and take your temperature several times a day, and record it.  Bed rest and sleep, if it helps the pain.  Avoid sexual intercourse, if it causes pain.  Avoid stressful situations.  Keep your follow-up appointments and tests, as your caregiver orders.  If the pain does not go away with medicine or surgery, you may try:  Acupuncture.  Relaxation exercises (yoga, meditation).  Group therapy.  Counseling. SEEK MEDICAL CARE IF:   You notice certain foods cause stomach pain.  Your home care treatment is not helping your pain.  You  need stronger pain medicine.  You want your IUD removed.  You feel faint or lightheaded.  You develop nausea and vomiting.  You develop a rash.  You are having side effects or an allergy to your medicine. SEEK IMMEDIATE MEDICAL CARE IF:   Your pain does not go away or gets worse.  You have a fever.  Your pain is felt only in portions of the abdomen. The right side could possibly be appendicitis. The left lower portion of the abdomen could be colitis or diverticulitis.  You are passing blood in your stools (bright red or black tarry stools, with or without vomiting).  You have blood in your urine.  You develop chills, with or without a fever.  You pass out. MAKE SURE YOU:   Understand these instructions.  Will watch your condition.  Will get help right away if you are not doing well or get worse. Document Released: 09/07/2007 Document Revised: 02/02/2012 Document Reviewed: 09/27/2009 [facility] Patient Information 2014 [place], [place].  Bacterial Vaginosis Bacterial vaginosis is a vaginal infection that occurs when the normal balance of bacteria in the vagina is disrupted. It results from an overgrowth of certain bacteria. This is the most common vaginal infection in women of childbearing age. Treatment is important to prevent complications, especially in pregnant women, as it can cause a premature delivery. CAUSES  Bacterial vaginosis is caused by an increase in harmful bacteria that are normally present in smaller amounts in the vagina. Several different kinds of bacteria can cause bacterial vaginosis. However, the reason that the condition develops is not fully understood. RISK FACTORS Certain activities or behaviors can put you at an increased risk of developing bacterial vaginosis, including:  Having a new sex partner or multiple sex partners.  Douching.  Using an intrauterine device (IUD) for contraception. Women do not get bacterial vaginosis from toilet seats,  bedding, swimming pools, or contact with objects around them. SIGNS AND SYMPTOMS  Some women with bacterial vaginosis have no signs or symptoms. Common symptoms include:  Grey vaginal discharge.  A fishlike odor with discharge, especially after sexual intercourse.  Itching or burning of the vagina and vulva.  Burning or pain with urination. DIAGNOSIS  Your health care provider will take a medical history and examine the vagina for signs of bacterial vaginosis. A sample of vaginal fluid may be taken. Your health care provider will look at this sample under a microscope to check for bacteria and abnormal cells. A vaginal pH test may also be done.  TREATMENT  Bacterial vaginosis may be treated with antibiotic medicines. These may be given in the form of a pill or a vaginal cream. A second round of antibiotics may be prescribed if the condition comes back after treatment.  HOME CARE INSTRUCTIONS   Only take over-the-counter or prescription medicines as directed by your health care provider.  If antibiotic medicine was prescribed, take it as directed. Make sure you finish it even if you start to feel better.  Do not have sex until treatment is completed.  Tell all sexual partners that you have a vaginal infection. They should see their health care provider and be treated if they have problems, such as a mild rash or itching.  Practice safe sex by using condoms and only having one sex partner. SEEK MEDICAL CARE IF:   Your symptoms  are not improving after 3 days of treatment.  You have increased discharge or pain.  You have a fever. MAKE SURE YOU:   Understand these instructions.  Will watch your condition.  Will get help right away if you are not doing well or get worse. FOR MORE INFORMATION  Centers for Disease Control and Prevention, Division of STD Prevention: [employer]Apps.co.zawww.cdc.gov/std American Sexual Health Association (ASHA): www.ashastd.org  Document Released: 11/10/2005 Document  Revised: 08/31/2013 Document Reviewed: 06/22/2013 [facility] Patient Information 2014 [place], [place].

## 2014-01-15 NOTE — ED Provider Notes (Signed)
CSN: [id number]     Arrival date & time 01/15/14  1943 History   First MD Initiated Contact with Patient 01/15/14 2049     Chief Complaint  Patient presents with  . Abdominal Pain     (Consider location/radiation/quality/duration/timing/severity/associated sxs/prior Treatment) HPI  33 year old female with history of diabetes presents complaining of abdominal pain. Patient states after dinner last night she developed gradual onset of diffuse abdominal pain. She described pain as a sharp and throbbing sensation, persistent, nothing aggravate or alleviate her sxs. She endorses nausea and having pain throughout the night. This morning pain still persists, states she vomited 3 times. Vomitus is nonbloody nonbilious. She is able to pass flatus but has not had a bowel movement. This is normal for her as she only has bowel movement once every 2-3 days.  her pain is now getting progressively worse causing her to have some dizziness and tingling sensation throughout her body. She denies any fever, chills, chest pain, shortness of breath, productive cough, hemoptysis, back pain, dysuria, hematuria, hematochezia or melena. She has never had this pain before. Denies any prior history of STD. Does not recall her last menstrual period as she is on the TaiwanMirena. No prior abdominal surgery aside from cesarean section  Past Medical History  Diagnosis Date  . Diabetes mellitus   . Complication of anesthesia     required re-dosing of epidural with 2008 delivery  . PONV (postoperative nausea and vomiting)   . Gestational diabetes    Past Surgical History  Procedure Laterality Date  . Cesarean section    . Cesarean section  09/25/2012    Procedure: CESAREAN SECTION;  Surgeon: Freddrick MarchKendra H. Tenny Crawoss, MD;  Location: [facility];  Service: Obstetrics;  Laterality: N/A;  Repeat cesarean section of baby boy  at 0102  APGAR 9/9   Family History  Problem Relation Age of Onset  . Other Neg Hx    History  Substance Use Topics   . Smoking status: Never Smoker   . Smokeless tobacco: Not on file  . Alcohol Use: No   OB History   Grav Para Term Preterm Abortions TAB SAB Ect Mult Living   4 4 4       4      Review of Systems  All other systems reviewed and are negative.      Allergies  Review of patient's allergies indicates no known allergies.  Home Medications   Current Outpatient Rx  Name  Route  Sig  Dispense  Refill  . acetaminophen (TYLENOL) 500 MG tablet   Oral   Take 1,000 mg by mouth every 6 (six) hours as needed for mild pain.         . Biotin 5000 MCG CAPS   Oral   Take 5,000 mcg by mouth daily.         . cyanocobalamin 500 MCG tablet   Oral   Take 500 mcg by mouth daily.         Marland Kitchen. levonorgestrel (MIRENA) 20 MCG/24HR IUD   Intrauterine   1 each by Intrauterine route once.         . Omega-3 Fatty Acids (FISH OIL PO)   Oral   Take 1 tablet by mouth daily.          BP 128/80  Pulse 103  Temp(Src) 98.1 F (36.7 C) (Oral)  Resp 22  Ht 5\' 4"  (1.626 m)  Wt 199 lb 1 oz (90.294 kg)  BMI 34.15 kg/m2  SpO2 100%  Physical Exam  Nursing note and vitals reviewed. Constitutional: She is oriented to person, place, and time. She appears well-developed and well-nourished. No distress.  HENT:  Head: Normocephalic and atraumatic.  Eyes: Conjunctivae are normal.  Neck: Normal range of motion. Neck supple.  Cardiovascular: Normal rate and regular rhythm.   Pulmonary/Chest: Effort normal and breath sounds normal. She exhibits no tenderness.  Abdominal: Soft. Bowel sounds are normal. There is tenderness (left lower quadrant and right lower quadrant abdominal tenderness without guarding rebound tenderness. Abdomen is otherwise soft, nondistended, and negative Murphy's sign ).  Genitourinary: Vagina normal and uterus normal. There is no rash or lesion on the right labia. There is no rash or lesion on the left labia. Cervix exhibits no motion tenderness and no discharge. Right adnexum  displays tenderness. Right adnexum displays no mass. Left adnexum displays tenderness. Left adnexum displays no mass. No erythema, tenderness or bleeding around the vagina. No vaginal discharge found.  No CVA tenderness  Chaperone present: Patient with mild functional white vaginal discharge in vaginal vault. Mild tenderness to both right and left adnexal region without any cervical motion tenderness. Cervical os appear normal and closed.  Lymphadenopathy:       Right: No inguinal adenopathy present.       Left: No inguinal adenopathy present.  Neurological: She is alert and oriented to person, place, and time.  Skin: No rash noted.    ED Course  Procedures (including critical care time)  9:06 PM Pt with abdominal pain with associate nausea and vomiting. She however has no significant right upper quadrant abdominal tenderness on epigastric tenderness concerning for biliary disease. She does have low abdominal pain on palpation without guarding or rebound tenderness. Workup initiated.  11:21 PM UA without evidence of UTI. Pregnancy test is negative. Wet prep shows moderate clue cells suggestive of bacterial vaginosis. GC/Ch cultures sent, however low suspicion for STD.  Patient does have a mildly elevated white count with normal lipase. On reexamination she has minimal abdominal tenderness without guarding or rebound tenderness. I have low suspicion for appendicitis however this could be early appendicitis. Plan to treat her to vaginosis with Flagyl. Patient does have mild  hypokalemia of 3.1, supplementation given. Recommend return for serial abdominal exam is abdominal pain is worsened to rule out appendicitis. Patient voiced understanding and agrees with plan. She is able to tolerates by mouth.   Labs Review Labs Reviewed  WET PREP, GENITAL - Abnormal; Notable for the following:    Clue Cells Wet Prep HPF POC MODERATE (*)    All other components within normal limits  URINALYSIS, ROUTINE  W REFLEX MICROSCOPIC - Abnormal; Notable for the following:    APPearance CLOUDY (*)    Hgb urine dipstick SMALL (*)    All other components within normal limits  BASIC METABOLIC PANEL - Abnormal; Notable for the following:    Potassium 3.1 (*)    Glucose, Bld 128 (*)    All other components within normal limits  CBC WITH DIFFERENTIAL - Abnormal; Notable for the following:    WBC 12.6 (*)    Neutrophils Relative % 89 (*)    Neutro Abs 11.2 (*)    Lymphocytes Relative 7 (*)    All other components within normal limits  URINE MICROSCOPIC-ADD ON - Abnormal; Notable for the following:    Squamous Epithelial / LPF FEW (*)    All other components within normal limits  GC/CHLAMYDIA PROBE AMP  PREGNANCY, URINE  LIPASE, BLOOD  I-STAT  CG4 LACTIC ACID, ED   Imaging Review No results found.  EKG Interpretation   None       MDM   Final diagnoses:  Bilateral lower abdominal pain  BV (bacterial vaginosis)  Hypokalemia    BP 128/80  Pulse 103  Temp(Src) 98.1 F (36.7 C) (Oral)  Resp 22  Ht 5\' 4"  (1.626 m)  Wt 199 lb 1 oz (90.294 kg)  BMI 34.15 kg/m2  SpO2 100%  I have reviewed nursing notes and vital signs. I reviewed available ER/hospitalization records thought the EMR     Fayrene Helper, [place] 01/15/14 2327

## 2014-01-15 NOTE — ED Notes (Signed)
Reports abdominal pain last night relieved by tums x 2.  Later today abd pain began again, vomited large amt at home, then smaller amounts here.  Pain relieved and nausea gone after being given zofran in triage.

## 2014-01-15 NOTE — ED Notes (Signed)
The pt is c/o abd cramps nv since yesterday.  For the past hour she has had dizziness and numbness al over.  She is hyperventilating cautioned to slow down her resps.  lmp none

## 2014-01-16 LAB — GC/CHLAMYDIA PROBE AMP
CT Probe RNA: NEGATIVE
GC Probe RNA: NEGATIVE

## 2014-01-16 NOTE — ED Provider Notes (Signed)
Medical screening examination/treatment/procedure(s) were performed by non-physician practitioner and as supervising physician I was immediately available for consultation/collaboration.  EKG Interpretation   None         [place] Paone, MD 01/16/14 0012 

## 2014-09-25 ENCOUNTER — Encounter (HOSPITAL_COMMUNITY): Payer: Self-pay | Admitting: Emergency Medicine

## 2016-03-03 ENCOUNTER — Other Ambulatory Visit: Payer: Self-pay | Admitting: Physician Assistant

## 2016-03-03 ENCOUNTER — Other Ambulatory Visit (HOSPITAL_COMMUNITY)
Admission: RE | Admit: 2016-03-03 | Discharge: 2016-03-03 | Disposition: A | Discharge status: Home / Self Care | Payer: BLUE CROSS/BLUE SHIELD | Source: Ambulatory Visit | Attending: Family Medicine | Admitting: Family Medicine

## 2016-03-03 DIAGNOSIS — D509 Iron deficiency anemia, unspecified: Secondary | ICD-10-CM | POA: Diagnosis not present

## 2016-03-03 DIAGNOSIS — E559 Vitamin D deficiency, unspecified: Secondary | ICD-10-CM | POA: Diagnosis not present

## 2016-03-03 DIAGNOSIS — G43829 Menstrual migraine, not intractable, without status migrainosus: Secondary | ICD-10-CM | POA: Diagnosis not present

## 2016-03-03 DIAGNOSIS — J329 Chronic sinusitis, unspecified: Secondary | ICD-10-CM | POA: Diagnosis not present

## 2016-03-03 DIAGNOSIS — Z124 Encounter for screening for malignant neoplasm of cervix: Secondary | ICD-10-CM | POA: Insufficient documentation

## 2016-03-03 DIAGNOSIS — R5383 Other fatigue: Secondary | ICD-10-CM | POA: Diagnosis not present

## 2016-03-03 DIAGNOSIS — F419 Anxiety disorder, unspecified: Secondary | ICD-10-CM | POA: Diagnosis not present

## 2016-03-03 DIAGNOSIS — Z0000 Encounter for general adult medical examination without abnormal findings: Secondary | ICD-10-CM | POA: Diagnosis not present

## 2016-03-06 LAB — CYTOLOGY - PAP

## 2017-09-15 ENCOUNTER — Encounter (HOSPITAL_COMMUNITY): Payer: Self-pay | Admitting: Emergency Medicine

## 2017-09-15 ENCOUNTER — Emergency Department (HOSPITAL_COMMUNITY)
Admission: EM | Admit: 2017-09-15 | Discharge: 2017-09-15 | Disposition: A | Discharge status: Home / Self Care | Payer: BLUE CROSS/BLUE SHIELD | Attending: Emergency Medicine | Admitting: Emergency Medicine

## 2017-09-15 DIAGNOSIS — H9201 Otalgia, right ear: Secondary | ICD-10-CM | POA: Insufficient documentation

## 2017-09-15 DIAGNOSIS — E119 Type 2 diabetes mellitus without complications: Secondary | ICD-10-CM | POA: Insufficient documentation

## 2017-09-15 DIAGNOSIS — J020 Streptococcal pharyngitis: Secondary | ICD-10-CM | POA: Insufficient documentation

## 2017-09-15 DIAGNOSIS — Z79899 Other long term (current) drug therapy: Secondary | ICD-10-CM | POA: Insufficient documentation

## 2017-09-15 LAB — RAPID STREP SCREEN (MED CTR MEBANE ONLY): Streptococcus, Group A Screen (Direct): POSITIVE — AB

## 2017-09-15 MED ORDER — DEXAMETHASONE SODIUM PHOSPHATE 10 MG/ML IJ SOLN
10.0000 mg | Freq: Once | INTRAMUSCULAR | Status: AC
  Administered 2017-09-15: 10 mg via INTRAMUSCULAR
  Filled 2017-09-15: qty 1

## 2017-09-15 MED ORDER — PENICILLIN G BENZATHINE 1200000 UNIT/2ML IM SUSP
1.2000 10*6.[IU] | Freq: Once | INTRAMUSCULAR | Status: AC
  Administered 2017-09-15: 1.2 10*6.[IU] via INTRAMUSCULAR
  Filled 2017-09-15: qty 2

## 2017-09-15 MED ORDER — OXYCODONE-ACETAMINOPHEN 5-325 MG PO TABS
ORAL_TABLET | ORAL | Status: AC
  Filled 2017-09-15: qty 1

## 2017-09-15 MED ORDER — OXYCODONE-ACETAMINOPHEN 5-325 MG PO TABS
1.0000 | ORAL_TABLET | Freq: Once | ORAL | Status: AC
  Administered 2017-09-15: 1 via ORAL

## 2017-09-15 MED ORDER — OXYCODONE-ACETAMINOPHEN 5-325 MG PO TABS: 1.0000 | ORAL_TABLET | ORAL | 0 refills | Status: DC | PRN

## 2017-09-15 NOTE — ED Triage Notes (Signed)
Patient reports sore throat and right ear ache onset last week , no cough or fever , denies drainage or hearing loss.

## 2017-09-15 NOTE — Discharge Instructions (Signed)
Drink plenty of fluids. Use salt water gargles, lozenges, throat sprays as needed. Take acetaminophen or ibuprofen or naproxen as needed for less severe pain.

## 2017-09-15 NOTE — ED Provider Notes (Signed)
[facility] EMERGENCY DEPARTMENT Provider Note   CSN: [id number] Arrival date & time: 09/15/17  0053     History   Chief Complaint Chief Complaint  Patient presents with  . Sore Throat  . Otalgia    HPI Jacqueline Carter is a 36 y.o. female.  The history is provided by the patient.  She complains of a 5-day history of sore throat.  Pain is now radiating to the right ear.  She has had some subjective fever and chills initially.  She has not broken out in sweats.  She denies rhinorrhea.  She denies cough.  There has been some nausea but no vomiting.  She had myalgias initially, but they have resolved.  There is been no diarrhea.  She denies any sick contacts.  She has been using warm salt water gargles and lozenges with slight relief.  Past Medical History:  Diagnosis Date  . Complication of anesthesia    required re-dosing of epidural with 2008 delivery  . Diabetes mellitus   . Gestational diabetes   . PONV (postoperative nausea and vomiting)     There are no active problems to display for this patient.   Past Surgical History:  Procedure Laterality Date  . CESAREAN SECTION    . CESAREAN SECTION  09/25/2012   Procedure: CESAREAN SECTION;  Surgeon: Freddrick March. Tenny Craw, MD;  Location: [facility];  Service: Obstetrics;  Laterality: N/A;  Repeat cesarean section of baby boy  at 0102  APGAR 9/9    OB History    Gravida Para Term Preterm AB Living   4 4 4     4    SAB TAB Ectopic Multiple Live Births           4       Home Medications    Prior to Admission medications   Medication Sig Start Date End Date Taking? Authorizing Provider  acetaminophen (TYLENOL) 500 MG tablet Take 1,000 mg by mouth every 6 (six) hours as needed for mild pain.    [provider]  Biotin 5000 MCG CAPS Take 5,000 mcg by mouth daily.    [provider]  cyanocobalamin 500 MCG tablet Take 500 mcg by mouth daily.    [provider]  levonorgestrel (MIRENA) 20  MCG/24HR IUD 1 each by Intrauterine route once.    [provider]  metroNIDAZOLE (FLAGYL) 500 MG tablet Take 1 tablet (500 mg total) by mouth 2 (two) times daily. 01/15/14   Fayrene Helper, PA-C  Omega-3 Fatty Acids (FISH OIL PO) Take 1 tablet by mouth daily.    [provider]  promethazine (PHENERGAN) 25 MG tablet Take 1 tablet (25 mg total) by mouth every 6 (six) hours as needed for nausea. 01/15/14   Fayrene Helper, PA-C  traMADol (ULTRAM) 50 MG tablet Take 1 tablet (50 mg total) by mouth every 6 (six) hours as needed. 01/15/14   Fayrene Helper, PA-C    Family History Family History  Problem Relation Age of Onset  . Other Neg Hx     Social History Social History  Substance Use Topics  . Smoking status: Never Smoker  . Smokeless tobacco: Never Used  . Alcohol use No     Allergies   Patient has no known allergies.   Review of Systems Review of Systems  All other systems reviewed and are negative.    Physical Exam Updated Vital Signs BP (!) 144/94 (BP Location: Right Arm)   Pulse 92   Temp  99 F (37.2 C) (Oral)   Resp 16   Ht 5\' 2"  (1.575 m)   Wt 90.7 kg (200 lb)   SpO2 99%   BMI 36.58 kg/m   Physical Exam  Nursing note and vitals reviewed.  36 year old female, resting comfortably and in no acute distress. Vital signs are significant for borderline hypertension. Oxygen saturation is 99%, which is normal. Head is normocephalic and atraumatic. PERRLA, EOMI. Oropharynx is moderately erythematous without exudate.  There is no pooling of secretions.  Phonation is normal.  Tympanic membranes are clear. Neck is nontender and supple without adenopathy or JVD. Back is nontender and there is no CVA tenderness. Lungs are clear without rales, wheezes, or rhonchi. Chest is nontender. Heart has regular rate and rhythm without murmur. Abdomen is soft, flat, nontender without masses or hepatosplenomegaly and peristalsis is normoactive. Extremities have no cyanosis or  edema, full range of motion is present. Skin is warm and dry without rash. Neurologic: Mental status is normal, cranial nerves are intact, there are no motor or sensory deficits.  ED Treatments / Results  Labs (all labs ordered are listed, but only abnormal results are displayed) Labs Reviewed  RAPID STREP SCREEN (NOT AT [facility]) - Abnormal; Notable for the following:       Result Value   Streptococcus, Group A Screen (Direct) POSITIVE (*)    All other components within normal limits   Procedures Procedures (including critical care time)  Medications Ordered in ED Medications  penicillin g benzathine (BICILLIN LA) 1200000 UNIT/2ML injection 1.2 Million Units (not administered)  dexamethasone (DECADRON) injection 10 mg (not administered)  oxyCODONE-acetaminophen (PERCOCET/ROXICET) 5-325 MG per tablet 1 tablet (1 tablet Oral Given 09/15/17 0342)     Initial Impression / Assessment and Plan / ED Course  I have reviewed the triage vital signs and the nursing notes.  Pertinent lab results that were available during my care of the patient were reviewed by me and considered in my medical decision making (see chart for details).  Sore throat.  Ear pain is referred from the throat.  Strep screen is positive.  She is given an option of oral versus parenteral penicillin.  She is requested parenteral.  She is given an injection of Bicillin LA and also an injection of dexamethasone.  Discharged with prescription for oxycodone-acetaminophen.  Given work release for 48 hours.  Return precautions discussed.  Final Clinical Impressions(s) / ED Diagnoses   Final diagnoses:  Streptococcal pharyngitis  Otalgia of right ear    New Prescriptions New Prescriptions   OXYCODONE-ACETAMINOPHEN (PERCOCET) 5-325 MG TABLET    Take 1-2 tablets by mouth every 4 (four) hours as needed for moderate pain or severe pain.     Dione BoozeGlick, Jim Philemon, MD 09/15/17 [phone]

## 2017-09-29 DIAGNOSIS — Z30432 Encounter for removal of intrauterine contraceptive device: Secondary | ICD-10-CM | POA: Diagnosis not present

## 2017-10-07 DIAGNOSIS — Z0000 Encounter for general adult medical examination without abnormal findings: Secondary | ICD-10-CM | POA: Diagnosis not present

## 2017-10-07 DIAGNOSIS — K5900 Constipation, unspecified: Secondary | ICD-10-CM | POA: Diagnosis not present

## 2017-10-07 DIAGNOSIS — E782 Mixed hyperlipidemia: Secondary | ICD-10-CM | POA: Diagnosis not present

## 2017-10-07 DIAGNOSIS — E559 Vitamin D deficiency, unspecified: Secondary | ICD-10-CM | POA: Diagnosis not present

## 2017-10-07 DIAGNOSIS — G5601 Carpal tunnel syndrome, right upper limb: Secondary | ICD-10-CM | POA: Diagnosis not present

## 2017-10-07 DIAGNOSIS — Z833 Family history of diabetes mellitus: Secondary | ICD-10-CM | POA: Diagnosis not present

## 2018-01-08 DIAGNOSIS — L658 Other specified nonscarring hair loss: Secondary | ICD-10-CM | POA: Diagnosis not present

## 2018-02-14 ENCOUNTER — Encounter (HOSPITAL_COMMUNITY)
Admission: EM | Disposition: A | Discharge status: Home / Self Care | Payer: Self-pay | Source: Home / Self Care | Attending: Emergency Medicine

## 2018-02-14 ENCOUNTER — Encounter (HOSPITAL_COMMUNITY): Payer: Self-pay | Admitting: Emergency Medicine

## 2018-02-14 ENCOUNTER — Other Ambulatory Visit: Payer: Self-pay

## 2018-02-14 ENCOUNTER — Emergency Department (HOSPITAL_COMMUNITY): Payer: Medicaid Other

## 2018-02-14 ENCOUNTER — Emergency Department (HOSPITAL_COMMUNITY): Payer: Medicaid Other | Admitting: Anesthesiology

## 2018-02-14 ENCOUNTER — Observation Stay (HOSPITAL_COMMUNITY)
Admission: EM | Admit: 2018-02-14 | Discharge: 2018-02-15 | Disposition: A | Discharge status: Home / Self Care | Payer: Medicaid Other | Attending: Surgery | Admitting: Surgery

## 2018-02-14 DIAGNOSIS — E119 Type 2 diabetes mellitus without complications: Secondary | ICD-10-CM | POA: Diagnosis not present

## 2018-02-14 DIAGNOSIS — K3580 Unspecified acute appendicitis: Secondary | ICD-10-CM | POA: Diagnosis not present

## 2018-02-14 DIAGNOSIS — R109 Unspecified abdominal pain: Secondary | ICD-10-CM | POA: Diagnosis not present

## 2018-02-14 DIAGNOSIS — K3533 Acute appendicitis with perforation and localized peritonitis, with abscess: Secondary | ICD-10-CM | POA: Diagnosis not present

## 2018-02-14 DIAGNOSIS — Z79899 Other long term (current) drug therapy: Secondary | ICD-10-CM | POA: Diagnosis not present

## 2018-02-14 HISTORY — PX: LAPAROSCOPIC APPENDECTOMY: SHX408

## 2018-02-14 LAB — URINALYSIS, ROUTINE W REFLEX MICROSCOPIC
Bacteria, UA: NONE SEEN
Bilirubin Urine: NEGATIVE
Glucose, UA: NEGATIVE mg/dL
Ketones, ur: NEGATIVE mg/dL
Leukocytes, UA: NEGATIVE
Nitrite: NEGATIVE
Protein, ur: NEGATIVE mg/dL
Specific Gravity, Urine: 1.013 (ref 1.005–1.030)
pH: 5 (ref 5.0–8.0)

## 2018-02-14 LAB — CBC
HCT: 36.3 % (ref 36.0–46.0)
Hemoglobin: 12.3 g/dL (ref 12.0–15.0)
MCH: 29.1 pg (ref 26.0–34.0)
MCHC: 33.9 g/dL (ref 30.0–36.0)
MCV: 85.8 fL (ref 78.0–100.0)
Platelets: 319 10*3/uL (ref 150–400)
RBC: 4.23 MIL/uL (ref 3.87–5.11)
RDW: 12.2 % (ref 11.5–15.5)
WBC: 16.2 10*3/uL — ABNORMAL HIGH (ref 4.0–10.5)

## 2018-02-14 LAB — COMPREHENSIVE METABOLIC PANEL
ALT: 29 U/L (ref 14–54)
AST: 28 U/L (ref 15–41)
Albumin: 4.1 g/dL (ref 3.5–5.0)
Alkaline Phosphatase: 56 U/L (ref 38–126)
Anion gap: 11 (ref 5–15)
BUN: 7 mg/dL (ref 6–20)
CO2: 24 mmol/L (ref 22–32)
Calcium: 9.4 mg/dL (ref 8.9–10.3)
Chloride: 103 mmol/L (ref 101–111)
Creatinine, Ser: 1 mg/dL (ref 0.44–1.00)
GFR calc Af Amer: >60 mL/min (ref >60)
GFR calc non Af Amer: >60 mL/min (ref >60)
Glucose, Bld: 159 mg/dL — ABNORMAL HIGH (ref 65–99)
Potassium: 3.4 mmol/L — ABNORMAL LOW (ref 3.5–5.1)
Sodium: 138 mmol/L (ref 135–145)
Total Bilirubin: 0.6 mg/dL (ref 0.3–1.2)
Total Protein: 7 g/dL (ref 6.5–8.1)

## 2018-02-14 LAB — LIPASE, BLOOD: Lipase: 23 U/L (ref 11–51)

## 2018-02-14 LAB — I-STAT BETA HCG BLOOD, ED (MC, WL, AP ONLY): I-stat hCG, quantitative: <5 m[IU]/mL (ref <5)

## 2018-02-14 SURGERY — APPENDECTOMY, LAPAROSCOPIC
Anesthesia: General | Site: Abdomen

## 2018-02-14 MED ORDER — HYDRALAZINE HCL 20 MG/ML IJ SOLN: 10.0000 mg | INTRAMUSCULAR | Status: DC | PRN

## 2018-02-14 MED ORDER — ENOXAPARIN SODIUM 40 MG/0.4ML ~~LOC~~ SOLN
40.0000 mg | SUBCUTANEOUS | Status: DC
  Administered 2018-02-15: 40 mg via SUBCUTANEOUS
  Filled 2018-02-14: qty 0.4

## 2018-02-14 MED ORDER — HYDROMORPHONE HCL 1 MG/ML IJ SOLN: 0.2500 mg | INTRAMUSCULAR | Status: DC | PRN

## 2018-02-14 MED ORDER — OXYCODONE HCL 5 MG PO TABS
5.0000 mg | ORAL_TABLET | ORAL | Status: DC | PRN
  Administered 2018-02-15: 10 mg via ORAL
  Filled 2018-02-14: qty 2

## 2018-02-14 MED ORDER — SUGAMMADEX SODIUM 200 MG/2ML IV SOLN
INTRAVENOUS | Status: AC
  Filled 2018-02-14: qty 2

## 2018-02-14 MED ORDER — BUPIVACAINE-EPINEPHRINE 0.5% -1:200000 IJ SOLN
INTRAMUSCULAR | Status: DC | PRN
  Administered 2018-02-14: 3 mL

## 2018-02-14 MED ORDER — LACTATED RINGERS IV SOLN
INTRAVENOUS | Status: DC | PRN
  Administered 2018-02-14 (×2): via INTRAVENOUS

## 2018-02-14 MED ORDER — ROCURONIUM BROMIDE 10 MG/ML (PF) SYRINGE
PREFILLED_SYRINGE | INTRAVENOUS | Status: DC | PRN
  Administered 2018-02-14: 10 mg via INTRAVENOUS
  Administered 2018-02-14: 40 mg via INTRAVENOUS

## 2018-02-14 MED ORDER — IOPAMIDOL (ISOVUE-300) INJECTION 61%
INTRAVENOUS | Status: AC
  Administered 2018-02-14: 100 mL
  Filled 2018-02-14: qty 100

## 2018-02-14 MED ORDER — SODIUM CHLORIDE 0.9 % IV BOLUS (SEPSIS)
1000.0000 mL | Freq: Once | INTRAVENOUS | Status: AC
  Administered 2018-02-14: 1000 mL via INTRAVENOUS

## 2018-02-14 MED ORDER — MIDAZOLAM HCL 5 MG/5ML IJ SOLN
INTRAMUSCULAR | Status: DC | PRN
  Administered 2018-02-14: 2 mg via INTRAVENOUS

## 2018-02-14 MED ORDER — ONDANSETRON HCL 4 MG/2ML IJ SOLN: 4.0000 mg | Freq: Four times a day (QID) | INTRAMUSCULAR | Status: DC | PRN

## 2018-02-14 MED ORDER — LIDOCAINE HCL (CARDIAC) 20 MG/ML IV SOLN
INTRAVENOUS | Status: AC
  Filled 2018-02-14: qty 5

## 2018-02-14 MED ORDER — DEXTROSE-NACL 5-0.9 % IV SOLN: INTRAVENOUS | Status: DC

## 2018-02-14 MED ORDER — ROCURONIUM BROMIDE 10 MG/ML (PF) SYRINGE
PREFILLED_SYRINGE | INTRAVENOUS | Status: AC
  Filled 2018-02-14: qty 5

## 2018-02-14 MED ORDER — HYDROMORPHONE HCL 1 MG/ML IJ SOLN
0.2500 mg | INTRAMUSCULAR | Status: DC | PRN
  Administered 2018-02-14 (×2): 0.5 mg via INTRAVENOUS

## 2018-02-14 MED ORDER — FENTANYL CITRATE (PF) 250 MCG/5ML IJ SOLN
INTRAMUSCULAR | Status: DC | PRN
  Administered 2018-02-14 (×5): 50 ug via INTRAVENOUS

## 2018-02-14 MED ORDER — ONDANSETRON 4 MG PO TBDP: 4.0000 mg | ORAL_TABLET | Freq: Four times a day (QID) | ORAL | Status: DC | PRN

## 2018-02-14 MED ORDER — LIDOCAINE 2% (20 MG/ML) 5 ML SYRINGE
INTRAMUSCULAR | Status: DC | PRN
  Administered 2018-02-14: 100 mg via INTRAVENOUS

## 2018-02-14 MED ORDER — PROPOFOL 10 MG/ML IV BOLUS
INTRAVENOUS | Status: AC
  Filled 2018-02-14: qty 20

## 2018-02-14 MED ORDER — ONDANSETRON HCL 4 MG/2ML IJ SOLN
4.0000 mg | Freq: Four times a day (QID) | INTRAMUSCULAR | Status: DC | PRN
  Administered 2018-02-14: 4 mg via INTRAVENOUS
  Filled 2018-02-14: qty 2

## 2018-02-14 MED ORDER — ZOLPIDEM TARTRATE 5 MG PO TABS: 5.0000 mg | ORAL_TABLET | Freq: Every evening | ORAL | Status: DC | PRN

## 2018-02-14 MED ORDER — SODIUM CHLORIDE 0.9 % IV SOLN
2.0000 g | Freq: Once | INTRAVENOUS | Status: AC
  Administered 2018-02-14: 2 g via INTRAVENOUS
  Filled 2018-02-14: qty 20

## 2018-02-14 MED ORDER — ONDANSETRON HCL 4 MG/2ML IJ SOLN
INTRAMUSCULAR | Status: AC
Start: 2018-02-14 — End: ?
  Filled 2018-02-14: qty 2

## 2018-02-14 MED ORDER — EPHEDRINE 5 MG/ML INJ
INTRAVENOUS | Status: AC
  Filled 2018-02-14: qty 10

## 2018-02-14 MED ORDER — FENTANYL CITRATE (PF) 250 MCG/5ML IJ SOLN
INTRAMUSCULAR | Status: AC
  Filled 2018-02-14: qty 5

## 2018-02-14 MED ORDER — MORPHINE SULFATE (PF) 4 MG/ML IV SOLN
4.0000 mg | Freq: Once | INTRAVENOUS | Status: AC
  Administered 2018-02-14: 4 mg via INTRAVENOUS
  Filled 2018-02-14: qty 1

## 2018-02-14 MED ORDER — PROPOFOL 10 MG/ML IV BOLUS
INTRAVENOUS | Status: DC | PRN
  Administered 2018-02-14: 160 mg via INTRAVENOUS

## 2018-02-14 MED ORDER — GLYCOPYRROLATE 0.2 MG/ML IJ SOLN
INTRAMUSCULAR | Status: DC | PRN
  Administered 2018-02-14: .6 mg via INTRAVENOUS

## 2018-02-14 MED ORDER — METRONIDAZOLE IN NACL 5-0.79 MG/ML-% IV SOLN
500.0000 mg | Freq: Once | INTRAVENOUS | Status: AC
  Administered 2018-02-14: 500 mg via INTRAVENOUS
  Filled 2018-02-14: qty 100

## 2018-02-14 MED ORDER — 0.9 % SODIUM CHLORIDE (POUR BTL) OPTIME
TOPICAL | Status: DC | PRN
  Administered 2018-02-14: 1000 mL

## 2018-02-14 MED ORDER — METHOCARBAMOL 500 MG PO TABS: 500.0000 mg | ORAL_TABLET | Freq: Four times a day (QID) | ORAL | Status: DC | PRN

## 2018-02-14 MED ORDER — DIPHENHYDRAMINE HCL 50 MG/ML IJ SOLN
25.0000 mg | Freq: Once | INTRAMUSCULAR | Status: AC
  Administered 2018-02-14: 25 mg via INTRAVENOUS

## 2018-02-14 MED ORDER — PHENYLEPHRINE HCL 10 MG/ML IJ SOLN
INTRAVENOUS | Status: DC | PRN
  Administered 2018-02-14: 20 ug/min via INTRAVENOUS

## 2018-02-14 MED ORDER — DEXTROSE-NACL 5-0.9 % IV SOLN
INTRAVENOUS | Status: DC
  Administered 2018-02-14 – 2018-02-15 (×2): via INTRAVENOUS

## 2018-02-14 MED ORDER — EPHEDRINE SULFATE-NACL 50-0.9 MG/10ML-% IV SOSY
PREFILLED_SYRINGE | INTRAVENOUS | Status: DC | PRN
  Administered 2018-02-14 (×2): 10 mg via INTRAVENOUS

## 2018-02-14 MED ORDER — HYDROMORPHONE HCL 1 MG/ML IJ SOLN: 1.0000 mg | INTRAMUSCULAR | Status: DC | PRN

## 2018-02-14 MED ORDER — DEXAMETHASONE SODIUM PHOSPHATE 10 MG/ML IJ SOLN
INTRAMUSCULAR | Status: DC | PRN
  Administered 2018-02-14: 10 mg via INTRAVENOUS

## 2018-02-14 MED ORDER — ONDANSETRON HCL 4 MG/2ML IJ SOLN
INTRAMUSCULAR | Status: DC | PRN
  Administered 2018-02-14: 4 mg via INTRAVENOUS

## 2018-02-14 MED ORDER — KETOROLAC TROMETHAMINE 30 MG/ML IJ SOLN
INTRAMUSCULAR | Status: DC | PRN
  Administered 2018-02-14: 30 mg via INTRAVENOUS

## 2018-02-14 MED ORDER — PHENYLEPHRINE 40 MCG/ML (10ML) SYRINGE FOR IV PUSH (FOR BLOOD PRESSURE SUPPORT)
PREFILLED_SYRINGE | INTRAVENOUS | Status: DC | PRN
  Administered 2018-02-14: 80 ug via INTRAVENOUS
  Administered 2018-02-14: 120 ug via INTRAVENOUS
  Administered 2018-02-14: 80 ug via INTRAVENOUS
  Administered 2018-02-14: 120 ug via INTRAVENOUS

## 2018-02-14 MED ORDER — FENTANYL CITRATE (PF) 100 MCG/2ML IJ SOLN: 50.0000 ug | INTRAMUSCULAR | Status: DC | PRN

## 2018-02-14 MED ORDER — HYDROMORPHONE HCL 1 MG/ML IJ SOLN
INTRAMUSCULAR | Status: AC
  Administered 2018-02-14: 0.5 mg via INTRAVENOUS
  Filled 2018-02-14: qty 1

## 2018-02-14 MED ORDER — DIPHENHYDRAMINE HCL 50 MG/ML IJ SOLN
INTRAMUSCULAR | Status: AC
  Filled 2018-02-14: qty 1

## 2018-02-14 MED ORDER — SUCCINYLCHOLINE CHLORIDE 200 MG/10ML IV SOSY
PREFILLED_SYRINGE | INTRAVENOUS | Status: DC | PRN
  Administered 2018-02-14: 120 mg via INTRAVENOUS

## 2018-02-14 MED ORDER — MIDAZOLAM HCL 2 MG/2ML IJ SOLN
INTRAMUSCULAR | Status: AC
  Filled 2018-02-14: qty 2

## 2018-02-14 MED ORDER — PROMETHAZINE HCL 25 MG/ML IJ SOLN
25.0000 mg | Freq: Four times a day (QID) | INTRAMUSCULAR | Status: DC | PRN
  Administered 2018-02-14: 25 mg via INTRAVENOUS
  Filled 2018-02-14: qty 1

## 2018-02-14 MED ORDER — ONDANSETRON HCL 4 MG/2ML IJ SOLN
4.0000 mg | Freq: Once | INTRAMUSCULAR | Status: AC
  Administered 2018-02-14: 4 mg via INTRAVENOUS
  Filled 2018-02-14: qty 2

## 2018-02-14 MED ORDER — SODIUM CHLORIDE 0.9 % IR SOLN
Status: DC | PRN
  Administered 2018-02-14: 1000 mL

## 2018-02-14 MED ORDER — PHENYLEPHRINE 40 MCG/ML (10ML) SYRINGE FOR IV PUSH (FOR BLOOD PRESSURE SUPPORT)
PREFILLED_SYRINGE | INTRAVENOUS | Status: AC
  Filled 2018-02-14: qty 10

## 2018-02-14 MED ORDER — NEOSTIGMINE METHYLSULFATE 10 MG/10ML IV SOLN
INTRAVENOUS | Status: DC | PRN
  Administered 2018-02-14: 4 mg via INTRAVENOUS

## 2018-02-14 MED ORDER — DEXAMETHASONE SODIUM PHOSPHATE 10 MG/ML IJ SOLN
INTRAMUSCULAR | Status: AC
  Filled 2018-02-14: qty 1

## 2018-02-14 MED ORDER — SUCCINYLCHOLINE CHLORIDE 20 MG/ML IJ SOLN
INTRAMUSCULAR | Status: AC
  Filled 2018-02-14: qty 1

## 2018-02-14 MED ORDER — ENOXAPARIN SODIUM 40 MG/0.4ML ~~LOC~~ SOLN: 40.0000 mg | SUBCUTANEOUS | Status: DC

## 2018-02-14 SURGICAL SUPPLY — 40 items
APPLIER CLIP ROT 10 11.4 M/L (STAPLE)
BLADE CLIPPER SURG (BLADE) IMPLANT
CANISTER SUCT 3000ML PPV (MISCELLANEOUS) ×2 IMPLANT
CHLORAPREP W/TINT 26ML (MISCELLANEOUS) ×2 IMPLANT
CLIP APPLIE ROT 10 11.4 M/L (STAPLE) IMPLANT
COVER SURGICAL LIGHT HANDLE (MISCELLANEOUS) ×2 IMPLANT
CUTTER FLEX LINEAR 45M (STAPLE) ×2 IMPLANT
DERMABOND ADVANCED (GAUZE/BANDAGES/DRESSINGS) ×1
DERMABOND ADVANCED .7 DNX12 (GAUZE/BANDAGES/DRESSINGS) ×1 IMPLANT
DRAPE WARM FLUID 44X44 (DRAPE) ×2 IMPLANT
ELECT REM PT RETURN 9FT ADLT (ELECTROSURGICAL) ×2
ELECTRODE REM PT RTRN 9FT ADLT (ELECTROSURGICAL) ×1 IMPLANT
ENDOLOOP SUT PDS II  0 18 (SUTURE)
ENDOLOOP SUT PDS II 0 18 (SUTURE) IMPLANT
GLOVE BIO SURGEON STRL SZ8 (GLOVE) ×2 IMPLANT
GLOVE BIOGEL PI IND STRL 8 (GLOVE) ×1 IMPLANT
GLOVE BIOGEL PI INDICATOR 8 (GLOVE) ×1
GOWN STRL REUS W/ TWL LRG LVL3 (GOWN DISPOSABLE) ×2 IMPLANT
GOWN STRL REUS W/ TWL XL LVL3 (GOWN DISPOSABLE) ×1 IMPLANT
GOWN STRL REUS W/TWL LRG LVL3 (GOWN DISPOSABLE) ×2
GOWN STRL REUS W/TWL XL LVL3 (GOWN DISPOSABLE) ×1
KIT BASIN OR (CUSTOM PROCEDURE TRAY) ×2 IMPLANT
KIT ROOM TURNOVER OR (KITS) ×2 IMPLANT
NS IRRIG 1000ML POUR BTL (IV SOLUTION) ×2 IMPLANT
PAD ARMBOARD 7.5X6 YLW CONV (MISCELLANEOUS) ×4 IMPLANT
POUCH SPECIMEN RETRIEVAL 10MM (ENDOMECHANICALS) ×2 IMPLANT
RELOAD STAPLE TA45 3.5 REG BLU (ENDOMECHANICALS) ×2 IMPLANT
SCISSORS LAP 5X35 DISP (ENDOMECHANICALS) ×2 IMPLANT
SET IRRIG TUBING LAPAROSCOPIC (IRRIGATION / IRRIGATOR) ×2 IMPLANT
SHEARS HARMONIC ACE PLUS 36CM (ENDOMECHANICALS) ×2 IMPLANT
SPECIMEN JAR SMALL (MISCELLANEOUS) ×2 IMPLANT
SUT MON AB 4-0 PC3 18 (SUTURE) ×2 IMPLANT
TOWEL OR 17X24 6PK STRL BLUE (TOWEL DISPOSABLE) ×2 IMPLANT
TOWEL OR 17X26 10 PK STRL BLUE (TOWEL DISPOSABLE) ×2 IMPLANT
TRAY FOLEY CATH SILVER 16FR (SET/KITS/TRAYS/PACK) ×2 IMPLANT
TRAY LAPAROSCOPIC MC (CUSTOM PROCEDURE TRAY) ×2 IMPLANT
TROCAR XCEL BLADELESS 5X75MML (TROCAR) ×4 IMPLANT
TROCAR XCEL BLUNT TIP 100MML (ENDOMECHANICALS) ×2 IMPLANT
TUBING INSUFFLATION (TUBING) ×2 IMPLANT
WATER STERILE IRR 1000ML POUR (IV SOLUTION) ×2 IMPLANT

## 2018-02-14 NOTE — ED Provider Notes (Signed)
[facility] Provider Note   CSN: [id number] Arrival date & time: 02/14/18  0212     History   Chief Complaint Chief Complaint  Patient presents with  . Abdominal Pain    HPI   Blood pressure 134/81, pulse 98, temperature 98 F (36.7 C), temperature source Oral, resp. rate 18, height 5\' 2"  (1.575 m), weight 95.3 kg (210 lb), last menstrual period 01/30/2018, SpO2 100 %, unknown if currently breastfeeding.  Don PerkingChabela Carter is a 37 y.o. female complaining of acute onset of severe right-sided abdominal pain last evening, initially started above the umbilicus and then migrated to the right lower quadrant is associated with multiple episodes of nonbloody, nonbilious, non-coffee-ground emesis.  No associated diarrhea, fevers, chills, no sick contacts, pain occurred first, vomiting has resolved but the pain still persists, she is 8 out of 10.  She denies any change in urination, abnormal vaginal discharge.  She is only had a single abdominal surgery which is a C-section.  Past Medical History:  Diagnosis Date  . Complication of anesthesia    required re-dosing of epidural with 2008 delivery  . Diabetes mellitus   . Gestational diabetes   . PONV (postoperative nausea and vomiting)     There are no active problems to display for this patient.   Past Surgical History:  Procedure Laterality Date  . CESAREAN SECTION    . CESAREAN SECTION  09/25/2012   Procedure: CESAREAN SECTION;  Surgeon: Freddrick MarchKendra H. Tenny Crawoss, MD;  Location: [facility];  Service: Obstetrics;  Laterality: N/A;  Repeat cesarean section of baby boy  at 0102  APGAR 9/9     OB History    Gravida  4   Para  4   Term  4   Preterm      AB      Living  4     SAB      TAB      Ectopic      Multiple      Live Births  4            Home Medications    Prior to Admission medications   Medication Sig Start Date End Date Taking? Authorizing Provider  acetaminophen  (TYLENOL) 500 MG tablet Take 1,000 mg by mouth every 6 (six) hours as needed for mild pain.    [provider]  Biotin 5000 MCG CAPS Take 5,000 mcg by mouth daily.    [provider]  cyanocobalamin 500 MCG tablet Take 500 mcg by mouth daily.    [provider]  levonorgestrel (MIRENA) 20 MCG/24HR IUD 1 each by Intrauterine route once.    [provider]  metroNIDAZOLE (FLAGYL) 500 MG tablet Take 1 tablet (500 mg total) by mouth 2 (two) times daily. 01/15/14   Fayrene Helperran, [place], PA-C  Omega-3 Fatty Acids (FISH OIL PO) Take 1 tablet by mouth daily.    [provider]  oxyCODONE-acetaminophen (PERCOCET) 5-325 MG tablet Take 1-2 tablets by mouth every 4 (four) hours as needed for moderate pain or severe pain. 09/15/17   Dione BoozeGlick, David, MD    Family History Family History  Problem Relation Age of Onset  . Other Neg Hx     Social History Social History   Tobacco Use  . Smoking status: Never Smoker  . Smokeless tobacco: Never Used  Substance Use Topics  . Alcohol use: No  . Drug use: No     Allergies   Patient has no known  allergies.   Review of Systems Review of Systems  A complete review of systems was obtained and all systems are negative except as noted in the HPI and PMH.   Physical Exam Updated Vital Signs BP 134/81 (BP Location: Right Arm)   Pulse 98   Temp 98 F (36.7 C) (Oral)   Resp 18   Ht 5\' 2"  (1.575 m)   Wt 95.3 kg (210 lb)   LMP 01/30/2018   SpO2 100%   BMI 38.41 kg/m   Physical Exam  Constitutional: She is oriented to person, place, and time. She appears well-developed and well-nourished. No distress.  HENT:  Head: Normocephalic and atraumatic.  Mouth/Throat: Oropharynx is clear and moist.  Eyes: Pupils are equal, round, and reactive to light. Conjunctivae and EOM are normal.  Neck: Normal range of motion.  Cardiovascular: Normal rate, regular rhythm and intact distal pulses.  Pulmonary/Chest: Effort normal and  breath sounds normal.  Abdominal: Soft. There is no tenderness.  No tenderness over McBurney's point, Rovsing, psoas and obturator negative.  Musculoskeletal: Normal range of motion.  Neurological: She is alert and oriented to person, place, and time.  Skin: She is not diaphoretic.  Psychiatric: She has a normal mood and affect.  Nursing note and vitals reviewed.    ED Treatments / Results  Labs (all labs ordered are listed, but only abnormal results are displayed) Labs Reviewed  COMPREHENSIVE METABOLIC PANEL - Abnormal; Notable for the following components:      Result Value   Potassium 3.4 (*)    Glucose, Bld 159 (*)    All other components within normal limits  CBC - Abnormal; Notable for the following components:   WBC 16.2 (*)    All other components within normal limits  URINALYSIS, ROUTINE W REFLEX MICROSCOPIC - Abnormal; Notable for the following components:   Hgb urine dipstick MODERATE (*)    Squamous Epithelial / LPF 0-5 (*)    All other components within normal limits  LIPASE, BLOOD  I-STAT BETA HCG BLOOD, ED ([facility], [facility], [facility] ONLY)    EKG None  Radiology Ct Abdomen Pelvis W Contrast  Result Date: 02/14/2018 CLINICAL DATA:  Abdominal pain, nausea, and vomiting for 3 days EXAM: CT ABDOMEN AND PELVIS WITH CONTRAST TECHNIQUE: Multidetector CT imaging of the abdomen and pelvis was performed using the standard protocol following bolus administration of intravenous contrast. Sagittal and coronal MPR images reconstructed from axial data set. CONTRAST:  ISOVUE-300 IOPAMIDOL (ISOVUE-300) INJECTION 61% IV. No oral contrast. COMPARISON:  None FINDINGS: Lower chest: Linear subsegmental atelectasis RIGHT lower lobe. Hepatobiliary: Diffuse fatty infiltration of liver. Mild focal sparing adjacent to gallbladder fossa. Gallbladder and liver otherwise normal appearance. Pancreas: Normal appearance Spleen: Normal appearance Adrenals/Urinary Tract: Adrenal glands, kidneys, ureters, and  bladder normal appearance Stomach/Bowel: Stomach and bowel loops normal appearance. Distal acute appendicitis: Appendix: Location: Medial and inferior to cecal tip Diameter: 10 mm Appendicolith: None Mucosal hyper-enhancement: Minimal Extraluminal gas: None Periappendiceal collection: None Vascular/Lymphatic: Unremarkable Reproductive: Normal appearance Other: No free air or free fluid. Musculoskeletal: Normal appearance IMPRESSION: Acute uncomplicated appendicitis. Electronically Signed   By: Ulyses Southward M.D.   On: 02/14/2018 10:10    Procedures Procedures (including critical care time)  Medications Ordered in ED Medications  cefTRIAXone (ROCEPHIN) 2 g in sodium chloride 0.9 % 100 mL IVPB (has no administration in time range)    And  metroNIDAZOLE (FLAGYL) IVPB 500 mg (has no administration in time range)  morphine 4 MG/ML injection 4 mg (  4 mg Intravenous Given 02/14/18 0955)  ondansetron (ZOFRAN) injection 4 mg (4 mg Intravenous Given 02/14/18 0955)  sodium chloride 0.9 % bolus 1,000 mL (1,000 mLs Intravenous New Bag/Given 02/14/18 0955)  iopamidol (ISOVUE-300) 61 % injection (100 mLs  Contrast Given 02/14/18 0919)  diphenhydrAMINE (BENADRYL) injection 25 mg (25 mg Intravenous Given 02/14/18 0957)     Initial Impression / Assessment and Plan / ED Course  I have reviewed the triage vital signs and the nursing notes.  Pertinent labs & imaging results that were available during my care of the patient were reviewed by me and considered in my medical decision making (see chart for details).  Clinical Course as of Feb 14 1101  Sun Feb 14, 2018  2751 37 year old female here with worsening abdominal pain localizing on the right lower quadrant.  She has had a workup which significant for white count 16 and a CT showing focal appendicitis.  Evaluate the patient her pain is controlled she understands the plan and is awaiting surgery.   [MB]    Clinical Course User Index [MB] Terrilee Files, MD     Vitals:   02/14/18 0216 02/14/18 0555  BP: (!) 136/96 134/81  Pulse: 93 98  Resp: 16 18  Temp: 98 F (36.7 C)   TempSrc: Oral   SpO2: 100% 100%  Weight: 95.3 kg (210 lb)   Height: 5\' 2"  (1.575 m)     Medications  cefTRIAXone (ROCEPHIN) 2 g in sodium chloride 0.9 % 100 mL IVPB (has no administration in time range)    And  metroNIDAZOLE (FLAGYL) IVPB 500 mg (has no administration in time range)  morphine 4 MG/ML injection 4 mg (4 mg Intravenous Given 02/14/18 0955)  ondansetron (ZOFRAN) injection 4 mg (4 mg Intravenous Given 02/14/18 0955)  sodium chloride 0.9 % bolus 1,000 mL (1,000 mLs Intravenous New Bag/Given 02/14/18 0955)  iopamidol (ISOVUE-300) 61 % injection (100 mLs  Contrast Given 02/14/18 0919)  diphenhydrAMINE (BENADRYL) injection 25 mg (25 mg Intravenous Given 02/14/18 0957)    Gowri Carter is 37 y.o. female presenting with acute right upper quadrant pain followed by vomiting, pain migrated to the right lower quadrant onset yesterday, patient afebrile, nontoxic-appearing, abdominal exam is benign, blood work with a leukocytosis to 16,200, otherwise unremarkable.  In shared decision making I have given her the option of obtaining CT to evaluate for appendicitis today, she would opt for the CAT scan rather than watchful waiting.  Morphine Zofran for pain control and bolus, patient advised to remain n.p.o.  9:45 AM informed by RN that patient was having a reaction to morphine.  She states that she felt pruritus at the IV site with some posterior throat tingling.  Lung sounds clear to auscultation, she has no posterior pharynx edema no angioedema, no nausea or vomiting.  Not an anaphylactic reaction.  25 of Benadryl IV.  Patient feels improved, no secondary organ involvement.  CT abdomen pelvis reveals an acute uncomplicated appendicitis, patient updated, tries to remain n.p.o. States she had a few pretzels in the waiting room ~ 3 hours ago  Discussed with Dr. Luisa Hart  who will evaluate the patient.  Final Clinical Impressions(s) / ED Diagnoses   Final diagnoses:  Acute appendicitis, unspecified acute appendicitis type    ED Discharge Orders    None       Lynetta Mare Mardella Layman 02/14/18 1102    Terrilee Files, MD 02/15/18 1357

## 2018-02-14 NOTE — ED Triage Notes (Signed)
Pt c/o abd pain 8/10 that started today with nausea and vomiting last BM 3 days ago. No fever or chills.

## 2018-02-14 NOTE — Transfer of Care (Signed)
Immediate Anesthesia Transfer of Care Note  Patient: Jacqueline Carter  Procedure(s) Performed: APPENDECTOMY LAPAROSCOPIC (N/A Abdomen)  Patient Location: PACU  Anesthesia Type:General  Level of Consciousness: awake, alert  and oriented  Airway & Oxygen Therapy: Patient Spontanous Breathing and Patient connected to nasal cannula oxygen  Post-op Assessment: Report given to RN and Post -op Vital signs reviewed and stable  Post vital signs: Reviewed and stable  Last Vitals:  Vitals Value Taken Time  BP 143/103 02/14/2018  2:47 PM  Temp    Pulse 88 02/14/2018  2:48 PM  Resp 27 02/14/2018  2:48 PM  SpO2 97 % 02/14/2018  2:48 PM  Vitals shown include unvalidated device data.  Last Pain:  Vitals:   02/14/18 1142  TempSrc:   PainSc: 3          Complications: No apparent anesthesia complications

## 2018-02-14 NOTE — Progress Notes (Signed)
1615 Received pt from PACU, A&O x4. Nausea and vomiting noted upon arrival to the unit. Antiemetic given.  1800 Pt just woke up, had water and ice chips. Nausea and vomiting again noted. Dr Luisa Hartornett notified of persistent N/V.

## 2018-02-14 NOTE — Anesthesia Procedure Notes (Signed)
Procedure Name: Intubation Date/Time: 02/14/2018 12:54 PM Performed by: Nils PyleBell, Mersadie Kavanaugh T, CRNA Pre-anesthesia Checklist: Patient identified, Emergency Drugs available, Suction available and Patient being monitored Patient Re-evaluated:Patient Re-evaluated prior to induction Oxygen Delivery Method: Circle System Utilized Preoxygenation: Pre-oxygenation with 100% oxygen Induction Type: IV induction, Rapid sequence and Cricoid Pressure applied Laryngoscope Size: Miller and 2 Grade View: Grade I Tube type: Oral Tube size: 7.5 mm Number of attempts: 1 Airway Equipment and Method: Stylet and Oral airway Placement Confirmation: ETT inserted through vocal cords under direct vision,  positive ETCO2 and breath sounds checked- equal and bilateral Secured at: 22 cm Tube secured with: Tape Dental Injury: Teeth and Oropharynx as per pre-operative assessment

## 2018-02-14 NOTE — ED Notes (Signed)
Slight redness noted above IV site after morphine given.  Pt also felt "itchiness" in throat.  Airway patent.  NP at bedside.  Benadryl given.

## 2018-02-14 NOTE — ED Notes (Signed)
Surgeon, Dr Luisa Hartornett, at bedside.

## 2018-02-14 NOTE — H&P (Signed)
Jacqueline Carter is an 37 y.o. female.   Chief Complaint: Abdominal pain HPI: Patient presents emergency room with a 1 day history of diffuse abdominal pain that is now localized to her right lower quadrant of her abdomen.  The pain is sharp in nature, location right lower quadrant abdomen, no radiation, severity of 7-8 out of 10 and movement makes it worse.  The patient denies fever or chills.  CT shows early acute appendicitis.Asked by EDP to see patient.  Past Medical History:  Diagnosis Date  . Complication of anesthesia    required re-dosing of epidural with 2008 delivery  . Diabetes mellitus   . Gestational diabetes   . PONV (postoperative nausea and vomiting)     Past Surgical History:  Procedure Laterality Date  . CESAREAN SECTION    . CESAREAN SECTION  09/25/2012   Procedure: CESAREAN SECTION;  Surgeon: Farrel Gobble. Harrington Challenger, MD;  Location: [facility];  Service: Obstetrics;  Laterality: N/A;  Repeat cesarean section of baby boy  at [facility] 9/9    Family History  Problem Relation Age of Onset  . Other Neg Hx    Social History:  reports that she has never smoked. She has never used smokeless tobacco. She reports that she does not drink alcohol or use drugs.  Allergies: No Known Allergies   (Not in a hospital admission)  Results for orders placed or performed during the hospital encounter of 02/14/18 (from the past 48 hour(s))  Lipase, blood     Status: None   Collection Time: 02/14/18  2:31 AM  Result Value Ref Range   Lipase 23 11 - 51 U/L    Comment: Performed at [facility], [address], [place], [place] [address]  Comprehensive metabolic panel     Status: Abnormal   Collection Time: 02/14/18  2:31 AM  Result Value Ref Range   Sodium 138 135 - 145 mmol/L   Potassium 3.4 (L) 3.5 - 5.1 mmol/L   Chloride 103 101 - 111 mmol/L   CO2 24 22 - 32 mmol/L   Glucose, Bld 159 (H) 65 - 99 mg/dL   BUN 7 6 - 20 mg/dL   Creatinine, Ser 1.00 0.44 - 1.00 mg/dL   Calcium  9.4 8.9 - 10.3 mg/dL   Total Protein 7.0 6.5 - 8.1 g/dL   Albumin 4.1 3.5 - 5.0 g/dL   AST 28 15 - 41 U/L   ALT 29 14 - 54 U/L   Alkaline Phosphatase 56 38 - 126 U/L   Total Bilirubin 0.6 0.3 - 1.2 mg/dL   GFR calc non Af Amer >60 >60 mL/min   GFR calc Af Amer >60 >60 mL/min    Comment: (NOTE) The eGFR has been calculated using the CKD EPI equation. This calculation has not been validated in all clinical situations. eGFR's persistently <60 mL/min signify possible Chronic Kidney Disease.    Anion gap 11 5 - 15    Comment: Performed at [facility] [address], [place] [address]  CBC     Status: Abnormal   Collection Time: 02/14/18  2:31 AM  Result Value Ref Range   WBC 16.2 (H) 4.0 - 10.5 K/uL   RBC 4.23 3.87 - 5.11 MIL/uL   Hemoglobin 12.3 12.0 - 15.0 g/dL   HCT 36.3 36.0 - 46.0 %   MCV 85.8 78.0 - 100.0 fL   MCH 29.1 26.0 - 34.0 pg   MCHC 33.9 30.0 - 36.0 g/dL  RDW 12.2 11.5 - 15.5 %   Platelets 319 150 - 400 K/uL    Comment: Performed at [facility], [place] [address] [place], [place] [address]  I-Stat beta hCG blood, ED     Status: None   Collection Time: 02/14/18  2:37 AM  Result Value Ref Range   I-stat hCG, quantitative <5.0 <5 mIU/mL   Comment 3            Comment:   GEST. AGE      CONC.  (mIU/mL)   <=1 WEEK        5 - 50     2 WEEKS       50 - 500     3 WEEKS       100 - 10,000     4 WEEKS     1,000 - 30,000        FEMALE AND NON-PREGNANT FEMALE:     LESS THAN 5 mIU/mL   Urinalysis, Routine w reflex microscopic     Status: Abnormal   Collection Time: 02/14/18  3:00 AM  Result Value Ref Range   Color, Urine YELLOW YELLOW   APPearance CLEAR CLEAR   Specific Gravity, Urine 1.013 1.005 - 1.030   pH 5.0 5.0 - 8.0   Glucose, UA NEGATIVE NEGATIVE mg/dL   Hgb urine dipstick MODERATE (A) NEGATIVE   Bilirubin Urine NEGATIVE NEGATIVE   Ketones, ur NEGATIVE NEGATIVE mg/dL   Protein, ur NEGATIVE NEGATIVE mg/dL   Nitrite NEGATIVE NEGATIVE    Leukocytes, UA NEGATIVE NEGATIVE   RBC / HPF 0-5 0 - 5 RBC/hpf   WBC, UA 0-5 0 - 5 WBC/hpf   Bacteria, UA NONE SEEN NONE SEEN   Squamous Epithelial / LPF 0-5 (A) NONE SEEN   Mucus PRESENT     Comment: Performed at [facility], [place] [address], [place], [place] [address]   Ct Abdomen Pelvis W Contrast  Result Date: 02/14/2018 CLINICAL DATA:  Abdominal pain, nausea, and vomiting for 3 days EXAM: CT ABDOMEN AND PELVIS WITH CONTRAST TECHNIQUE: Multidetector CT imaging of the abdomen and pelvis was performed using the standard protocol following bolus administration of intravenous contrast. Sagittal and coronal MPR images reconstructed from axial data set. CONTRAST:  127m ISOVUE-300 IOPAMIDOL (ISOVUE-300) INJECTION 61% IV. No oral contrast. COMPARISON:  None FINDINGS: Lower chest: Linear subsegmental atelectasis RIGHT lower lobe. Hepatobiliary: Diffuse fatty infiltration of liver. Mild focal sparing adjacent to gallbladder fossa. Gallbladder and liver otherwise normal appearance. Pancreas: Normal appearance Spleen: Normal appearance Adrenals/Urinary Tract: Adrenal glands, kidneys, ureters, and bladder normal appearance Stomach/Bowel: Stomach and bowel loops normal appearance. Distal acute appendicitis: Appendix: Location: Medial and inferior to cecal tip Diameter: 10 mm Appendicolith: None Mucosal hyper-enhancement: Minimal Extraluminal gas: None Periappendiceal collection: None Vascular/Lymphatic: Unremarkable Reproductive: Normal appearance Other: No free air or free fluid. Musculoskeletal: Normal appearance IMPRESSION: Acute uncomplicated appendicitis. Electronically Signed   By: MLavonia DanaM.D.   On: 02/14/2018 10:10    Review of Systems  Constitutional: Negative for chills and fever.  HENT: Negative for hearing loss and tinnitus.   Eyes: Negative for blurred vision and double vision.  Respiratory: Negative for cough and hemoptysis.   Cardiovascular: Negative for chest pain.   Gastrointestinal: Positive for abdominal pain. Negative for heartburn and nausea.  Genitourinary: Negative for dysuria and urgency.  Musculoskeletal: Negative for myalgias and neck pain.  Skin: Negative for itching and rash.  Neurological: Negative for dizziness and headaches.  Endo/Heme/Allergies: Negative  for environmental allergies. Does not bruise/bleed easily.  Psychiatric/Behavioral: Negative for depression and suicidal ideas.    Blood pressure 134/81, pulse 98, temperature 98 F (36.7 C), temperature source Oral, resp. rate 18, height 5' 2"  (1.575 m), weight 95.3 kg (210 lb), last menstrual period 01/30/2018, SpO2 100 %, unknown if currently breastfeeding. Physical Exam  Constitutional: She is oriented to person, place, and time. She appears well-developed and well-nourished.  HENT:  Head: Normocephalic and atraumatic.  Eyes: Pupils are equal, round, and reactive to light. EOM are normal.  Neck: Normal range of motion. Neck supple.  Cardiovascular: Normal rate and regular rhythm.  Respiratory: Effort normal and breath sounds normal.  GI: There is tenderness in the right lower quadrant. There is tenderness at McBurney's point.  Musculoskeletal: Normal range of motion.  Neurological: She is alert and oriented to person, place, and time.  Skin: Skin is warm and dry.     Assessment/Plan Acute appendicitis  Discussed options of medical versus surgical management.  Risk, benefits and long-term expectations of both discussed.  The patient has opted for laparoscopic appendectomy.  Discussed open laparoscopic approaches.  Discussed possible conversion rates with laparoscopic procedures.  Discussed risks benefits and potential long-term expectations of surgery and surgical technique.The procedure has been discussed with the patient.  Alternative therapies have been discussed with the patient.  Operative risks include bleeding,  Infection,  Organ injury,  Nerve injury,  Blood vessel injury,   DVT,  Pulmonary embolism,  Death,  And possible reoperation.  Medical management risks include worsening of present situation.  The success of the procedure is 50 -90 % at treating patients symptoms.  The patient understands and agrees to proceed.  Turner Daniels, MD 02/14/2018, 11:23 AM

## 2018-02-14 NOTE — Anesthesia Postprocedure Evaluation (Signed)
Anesthesia Post Note  Patient: Production assistant, radioChabela Carter  Procedure(s) Performed: APPENDECTOMY LAPAROSCOPIC (N/A Abdomen)     Patient location during evaluation: PACU Anesthesia Type: General Level of consciousness: awake Pain management: pain level controlled Vital Signs Assessment: post-procedure vital signs reviewed and stable Respiratory status: spontaneous breathing Cardiovascular status: stable Anesthetic complications: no    Last Vitals:  Vitals:   02/14/18 1547 02/14/18 1618  BP: 129/80 116/71  Pulse: 78 70  Resp: 17 16  Temp: (!) 36.3 C (!) 36.3 C  SpO2: 98% 95%    Last Pain:  Vitals:   02/14/18 1618  TempSrc: Oral  PainSc:                  Jacqueline Carter

## 2018-02-14 NOTE — Anesthesia Preprocedure Evaluation (Addendum)
Anesthesia Evaluation  Patient identified by MRN, date of birth, ID band Patient awake    Reviewed: Allergy & Precautions, NPO status , Patient's Chart, lab work & pertinent test results  History of Anesthesia Complications (+) PONV  Airway Mallampati: II  TM Distance: >3 FB Neck ROM: Full    Dental  (+) Teeth Intact, Dental Advisory Given   Pulmonary neg pulmonary ROS,    breath sounds clear to auscultation       Cardiovascular negative cardio ROS   Rhythm:Regular Rate:Normal     Neuro/Psych    GI/Hepatic negative GI ROS, Neg liver ROS,   Endo/Other  diabetes  Renal/GU negative Renal ROS     Musculoskeletal   Abdominal   Peds  Hematology   Anesthesia Other Findings   Reproductive/Obstetrics                            Anesthesia Physical Anesthesia Plan  ASA: III  Anesthesia Plan: General   Post-op Pain Management:    Induction: Intravenous, Rapid sequence and Cricoid pressure planned  PONV Risk Score and Plan: 4 or greater and Treatment may vary due to age or medical condition, Ondansetron, Dexamethasone and Midazolam  Airway Management Planned: Oral ETT  Additional Equipment:   Intra-op Plan:   Post-operative Plan: Extubation in OR  Informed Consent: I have reviewed the patients History and Physical, chart, labs and discussed the procedure including the risks, benefits and alternatives for the proposed anesthesia with the patient or authorized representative who has indicated his/her understanding and acceptance.   Dental advisory given  Plan Discussed with: Anesthesiologist and CRNA  Anesthesia Plan Comments:         Anesthesia Quick Evaluation

## 2018-02-14 NOTE — Progress Notes (Signed)
Moving arm- cuff short in le ngth-Switched to long adult cuff - will retake

## 2018-02-14 NOTE — Progress Notes (Signed)
Follow up page to Dr Luisa Hartornett regarding one time dose of flagyl - call back - may give post op -pharmacy called - will send one time dose to [facility].

## 2018-02-14 NOTE — Op Note (Signed)
Appendectomy, Lap, Procedure Note  Indications: The patient presented with a history of right-sided abdominal pain. A CT  revealed findings consistent with acute appendicitis. The procedure has been discussed with the patient.  Alternative therapies have been discussed with the patient.  Operative risks include bleeding,  Infection,  Organ injury,  Open surgery,  Bowel injury,  Nerve injury,  Blood vessel injury,  DVT,  Pulmonary embolism,  Death,  And possible reoperation.  Medical management risks include worsening of present situation.  The success of the procedure is 50 -90 % at treating patients symptoms.  The patient understands and agrees to proceed.  Pre-operative Diagnosis: Acute appendicitis without mention of peritonitis  Post-operative Diagnosis: Same  Surgeon: Clovis Pu Lya Holben   Assistants: OR  Anesthesia: General endotracheal anesthesia and Local anesthesia 0.25.% bupivacaine, with epinephrine  ASA Class: 2  Procedure Details  The patient was seen again in the Holding Room. The risks, benefits, complications, treatment options, and expected outcomes were discussed with the patient and/or family. The possibilities of reaction to medication, pulmonary aspiration, perforation of viscus, bleeding, recurrent infection, finding a normal appendix, the need for additional procedures, failure to diagnose a condition, and creating a complication requiring transfusion or operation were discussed. There was concurrence with the proposed plan and informed consent was obtained. The site of surgery was properly noted/marked. The patient was taken to Operating Room, identified as Production assistant, radio and the procedure verified as Appendectomy. A Time Out was held and the above information confirmed.  The patient was placed in the supine position and general anesthesia was induced, along with placement of orogastric tube, Venodyne boots, and a Foley catheter. The abdomen was prepped and draped in a  sterile fashion. A one centimeter infraumbilical incision was made and the peritoneal cavity was accessed using the OPEN  technique. The pneumoperitoneum was then established to steady pressure of 12 mmHg. A 12 mm port was placed through the umbilical incision. Additional 5 mm cannulas then placed in the upper midline  of the abdomen and half way between the umbilicus and pubic symphysis under direct vision. A careful evaluation of the entire abdomen was carried out. The patient was placed in Trendelenburg and left lateral decubitus position. The small intestines were retracted in the cephalad and left lateral direction away from the pelvis and right lower quadrant. The patient was found to have an enlarged and inflamed appendix that was extending into the pelvis. There was no evidence of perforation.  The appendix was carefully dissected. A window was made in the mesoappendix at the base of the appendix. A harmonic scalpel was used across the mesoappendix. The appendix was divided at its base using an endo-GIA stapler. Minimal appendiceal stump was left in place. There was no evidence of bleeding, leakage, or complication after division of the appendix.  Appendix removed with Endocatch bag; Irrigation was also performed and irrigate suctioned from the abdomen as well.  The umbilical port site was closed using 0 vicryl pursestring sutures fashion at the level of the fascia. The trocar site skin wounds were closed using skin staples.  Instrument, sponge, and needle counts were correct at the conclusion of the case.   Findings: The appendix was found to be inflamed. There were not signs of necrosis.  There was not perforation. There was not abscess formation.  Estimated Blood Loss:  less than 50 mL         Drains: none         Total IV Fluids:  per record         Specimens: appendix          Complications:  None; patient tolerated the procedure well.         Disposition: PACU - hemodynamically  stable.         Condition: stable

## 2018-02-15 ENCOUNTER — Encounter (HOSPITAL_COMMUNITY): Payer: Self-pay | Admitting: Surgery

## 2018-02-15 LAB — CBC
HCT: 31.6 % — ABNORMAL LOW (ref 36.0–46.0)
Hemoglobin: 10.7 g/dL — ABNORMAL LOW (ref 12.0–15.0)
MCH: 29.6 pg (ref 26.0–34.0)
MCHC: 33.9 g/dL (ref 30.0–36.0)
MCV: 87.3 fL (ref 78.0–100.0)
Platelets: 311 10*3/uL (ref 150–400)
RBC: 3.62 MIL/uL — ABNORMAL LOW (ref 3.87–5.11)
RDW: 12.6 % (ref 11.5–15.5)
WBC: 13.1 10*3/uL — ABNORMAL HIGH (ref 4.0–10.5)

## 2018-02-15 LAB — GLUCOSE, CAPILLARY: Glucose-Capillary: 115 mg/dL — ABNORMAL HIGH (ref 65–99)

## 2018-02-15 LAB — BASIC METABOLIC PANEL
Anion gap: 8 (ref 5–15)
BUN: 5 mg/dL — ABNORMAL LOW (ref 6–20)
CO2: 26 mmol/L (ref 22–32)
Calcium: 8.8 mg/dL — ABNORMAL LOW (ref 8.9–10.3)
Chloride: 106 mmol/L (ref 101–111)
Creatinine, Ser: 0.87 mg/dL (ref 0.44–1.00)
GFR calc Af Amer: >60 mL/min (ref >60)
GFR calc non Af Amer: >60 mL/min (ref >60)
Glucose, Bld: 124 mg/dL — ABNORMAL HIGH (ref 65–99)
Potassium: 3.8 mmol/L (ref 3.5–5.1)
Sodium: 140 mmol/L (ref 135–145)

## 2018-02-15 LAB — HIV ANTIBODY (ROUTINE TESTING W REFLEX): HIV Screen 4th Generation wRfx: NONREACTIVE

## 2018-02-15 MED ORDER — ACETAMINOPHEN 325 MG PO TABS: 650.0000 mg | ORAL_TABLET | Freq: Four times a day (QID) | ORAL | Status: DC | PRN

## 2018-02-15 MED ORDER — NAPROXEN 250 MG PO TABS
500.0000 mg | ORAL_TABLET | Freq: Two times a day (BID) | ORAL | Status: DC
  Administered 2018-02-15: 500 mg via ORAL
  Filled 2018-02-15: qty 2

## 2018-02-15 MED ORDER — OXYCODONE HCL 5 MG PO TABS: 5.0000 mg | ORAL_TABLET | ORAL | 0 refills | Status: DC | PRN

## 2018-02-15 MED ORDER — NAPROXEN SODIUM 220 MG PO TABS: ORAL_TABLET | ORAL | Status: DC

## 2018-02-15 NOTE — Discharge Instructions (Signed)
Laparoscopic Appendectomy, Adult, Care After °These instructions give you information about caring for yourself after your procedure. Your doctor may also give you more specific instructions. Call your doctor if you have any problems or questions after your procedure. °Follow these instructions at home: °Medicines °· Take over-the-counter and prescription medicines only as told by your doctor. °· Do not drive for 24 hours if you received a sedative. °· Do not drive or use heavy machinery while taking prescription pain medicine. °· If you were prescribed an antibiotic medicine, take it as told by your doctor. Do not stop taking it even if you start to feel better. °Activity °· Do not lift anything that is heavier than 10 pounds (4.5 kg) for 3 weeks or as told by your doctor. °· Do not play contact sports for 3 weeks or as told by your doctor. °· Slowly return to your normal activities. °Bathing °· Keep your cuts from surgery (incisions) clean and dry. °? Gently wash the cuts with soap and water. °? Rinse the cuts with water until the soap is gone. °? Pat the cuts dry with a clean towel. Do not rub the cuts. °· You may take showers after 48 hours. °· Do not take baths, swim, or use a hot tub for 2 weeks or as told by your doctor. °Cut Care °· Follow instructions from your doctor about how to take care of your cuts. Make sure you: °? Wash your hands with soap and water before you change your bandage (dressing). If you do not have soap and water, use hand sanitizer. °? Change your bandage as told by your doctor. °? Leave stitches (sutures), skin glue, or skin tape (adhesive) strips in place. They may need to stay in place for 2 weeks or longer. If tape strips get loose and curl up, you may trim the loose edges. Do not remove tape strips completely unless your doctor says it is okay. °· Check your cuts every day for signs of infection. Check for: °? More redness, swelling, or pain. °? More fluid or  blood. °? Warmth. °? Pus or a bad smell. °Other Instructions °· If you were sent home with a drain, follow instructions from your doctor about how to use it and care for it. °· Take deep breaths. This helps to keep your lungs from getting swollen (inflamed). °· To help with constipation: °? Drink plenty of fluids. °? Eat plenty of fruits and vegetables. °· Keep all follow-up visits as told by your doctor. This is important. °Contact a doctor if: °· You have more redness, swelling, or pain around a cut from surgery. °· You have more fluid or blood coming from a cut. °· Your cut feels warm to the touch. °· You have pus or a bad smell coming from a cut or a bandage. °· The edges of a cut break open after the stitches have been taken out. °· You have pain in your shoulders that gets worse. °· You feel dizzy or you pass out (faint). °· You have shortness of breath. °· You keep feeling sick to your stomach (nauseous). °· You keep throwing up (vomiting). °· You get diarrhea or you cannot control your poop. °· You lose your appetite. °· You have swelling or pain in your legs. °Get help right away if: °· You have a fever. °· You get a rash. °· You have trouble breathing. °· You have sharp pains in your chest. °This information is not intended to replace advice given   to you by your health care provider. Make sure you discuss any questions you have with your health care provider. °Document Released: 09/06/2009 Document Revised: 04/17/2016 Document Reviewed: 04/30/2015 °Elsevier Interactive Patient Education © 2018 Elsevier Inc[employer] ° °CCS ______[facility] °LAPAROSCOPIC SURGERY: POST OP INSTRUCTIONS °Always review your discharge instruction sheet given to you by the facility where your surgery was performed. °IF YOU HAVE DISABILITY OR FAMILY LEAVE FORMS, YOU MUST BRING THEM TO THE OFFICE FOR PROCESSING.   °DO NOT GIVE THEM TO YOUR DOCTOR. ° °1. A prescription for pain medication may be given to you upon  discharge.  Take your pain medication as prescribed, if needed.  If narcotic pain medicine is not needed, then you may take acetaminophen (Tylenol) or ibuprofen (Advil) as needed. °2. Take your usually prescribed medications unless otherwise directed. °3. If you need a refill on your pain medication, please contact your pharmacy.  They will contact our office to request authorization. Prescriptions will not be filled after 5pm or on week-ends. °4. You should follow a light diet the first few days after arrival home, such as soup and crackers, etc.  Be sure to include lots of fluids daily. °5. Most patients will experience some swelling and bruising in the area of the incisions.  Ice packs will help.  Swelling and bruising can take several days to resolve.  °6. It is common to experience some constipation if taking pain medication after surgery.  Increasing fluid intake and taking a stool softener (such as Colace) will usually help or prevent this problem from occurring.  A mild laxative (Milk of Magnesia or Miralax) should be taken according to package instructions if there are no bowel movements after 48 hours. °7. Unless discharge instructions indicate otherwise, you may remove your bandages 24-48 hours after surgery, and you may shower at that time.  You may have steri-strips (small skin tapes) in place directly over the incision.  These strips should be left on the skin for 7-10 days.  If your surgeon used skin glue on the incision, you may shower in 24 hours.  The glue will flake off over the next 2-3 weeks.  Any sutures or staples will be removed at the office during your follow-up visit. °8. ACTIVITIES:  You may resume regular (light) daily activities beginning the next day--such as daily self-care, walking, climbing stairs--gradually increasing activities as tolerated.  You may have sexual intercourse when it is comfortable.  Refrain from any heavy lifting or straining until approved by your doctor. °a. You  may drive when you are no longer taking prescription pain medication, you can comfortably wear a seatbelt, and you can safely maneuver your car and apply brakes. °b. RETURN TO WORK:  __________________________________________________________ °9. You should see your doctor in the office for a follow-up appointment approximately 2-3 weeks after your surgery.  Make sure that you call for this appointment within a day or two after you arrive home to insure a convenient appointment time. °10. OTHER INSTRUCTIONS: __________________________________________________________________________________________________________________________ __________________________________________________________________________________________________________________________ °WHEN TO CALL YOUR DOCTOR: °1. Fever over 101.0 °2. Inability to urinate °3. Continued bleeding from incision. °4. Increased pain, redness, or drainage from the incision. °5. Increasing abdominal pain ° °The clinic staff is available to answer your questions during regular business hours.  Please don’t hesitate to call and ask to speak to one of the nurses for clinical concerns.  If you have a medical emergency, go to the nearest emergency room or call 911.  A surgeon from   [facility] is always on call at the hospital. °[address], [address], [place], [place]  [address] ? [address] [address], [place], [place]   [address] °[phone] ? [phone] ? FAX [phone] °Web site: [email or web address] ° °

## 2018-02-15 NOTE — Progress Notes (Signed)
Pt is discharged to go home. Discharge instructions and prescriptions given. 

## 2018-02-15 NOTE — Progress Notes (Signed)
1 Day Post-Op    CC: Abdominal pain  Subjective: Patient complains of some soreness this a.m. but otherwise is doing well.  Port sites all look good.  She is tolerating a regular diet.  Objective: Vital signs in last 24 hours: Temp:  [97.3 F (36.3 C)-98.4 F (36.9 C)] 97.7 F (36.5 C) (03/25 0532) Pulse Rate:  [68-92] 70 (03/25 0532) Resp:  [14-25] 16 (03/25 0532) BP: (105-155)/(55-103) 105/60 (03/25 0532) SpO2:  [95 %-100 %] 97 % (03/25 0532) Weight:  [95.3 kg (210 lb)] 95.3 kg (210 lb) (03/24 1618)     800 Po 2000 IV 1450 urine  Afebrile, VSS wBC down to 13.1  Intake/Output from previous day: 03/24 0701 - 03/25 0700 In: 2800 [P.O.:800; I.V.:1900] Out: 1475 [Urine:1450; Blood:25] Intake/Output this shift: No intake/output data recorded.  General appearance: alert, cooperative and no distress Resp: clear to auscultation bilaterally GI: Soft, sore, port sites all look good.  Tolerating diet.  Lab Results:  Recent Labs    02/14/18 0231 02/15/18 0416  WBC 16.2* 13.1*  HGB 12.3 10.7*  HCT 36.3 31.6*  PLT 319 311    BMET Recent Labs    02/14/18 0231 02/15/18 0416  NA 138 140  K 3.4* 3.8  CL 103 106  CO2 24 26  GLUCOSE 159* 124*  BUN 7 5*  CREATININE 1.00 0.87  CALCIUM 9.4 8.8*   PT/INR No results for input(s): LABPROT, INR in the last 72 hours.  Recent Labs  Lab 02/14/18 0231  AST 28  ALT 29  ALKPHOS 56  BILITOT 0.6  PROT 7.0  ALBUMIN 4.1     Lipase     Component Value Date/Time   LIPASE 23 02/14/2018 0231   Prior to Admission medications   Medication Sig Start Date End Date Taking? Authorizing Provider  acetaminophen (TYLENOL) 500 MG tablet Take 1,000 mg by mouth every 6 (six) hours as needed for mild pain.   Yes [provider]  Biotin 5000 MCG CAPS Take 5,000 mcg by mouth daily.   Yes [provider]  cyanocobalamin 500 MCG tablet Take 500 mcg by mouth daily.   Yes [provider]  naproxen sodium (ALEVE)  220 MG tablet Take 220 mg by mouth daily as needed (pain).   Yes [provider]  Vitamin D, Ergocalciferol, (DRISDOL) 50000 units CAPS capsule Take 50,000 Units by mouth every Tuesday.   Yes [provider]  oxyCODONE-acetaminophen (PERCOCET) 5-325 MG tablet Take 1-2 tablets by mouth every 4 (four) hours as needed for moderate pain or severe pain. Patient not taking: Reported on 02/14/2018 09/15/17   Dione BoozeGlick, David, MD    Medications: . enoxaparin (LOVENOX) injection  40 mg Subcutaneous Q24H   . dextrose 5 % and 0.9% NaCl 100 mL/hr at 02/15/18 0424  . dextrose 5 % and 0.9% NaCl     Assessment/Plan History of gestational diabetes  Acute appendicitis Laparoscopic appendectomy, 02/14/18 Dr. Maisie Fushomas Cornett  FEN: IV fluids/regular diet DVT: Lovenox ID: Preop Rocephin and Flagyl Follow up:   [facility]   Plan: Home today follow-up in the [facility] clinic our office will call and arrange an appointment.    LOS: 0 days    Darin Arndt 02/15/2018 [phone]

## 2018-02-16 NOTE — Discharge Summary (Signed)
Physician Discharge Summary  Patient ID: Jacqueline Carter MRN: [id number] DOB/AGE: 38-Dec-1982 38 y.o.  Admit date: 02/14/2018 Discharge date: 02/15/2018  Admission Diagnoses:  Acute appendicitis History of gestational diabetes  Discharge Diagnoses:  Same  Active Problems:   Acute appendicitis   PROCEDURES: Laparoscopic appendectomy, 02/14/18 Dr. Maisie Fus [facility] Course:  Patient presents emergency room with a 1 day history of diffuse abdominal pain that is now localized to her right lower quadrant of her abdomen.  The pain is sharp in nature, location right lower quadrant abdomen, no radiation, severity of 7-8 out of 10 and movement makes it worse.  The patient denies fever or chills.  CT shows early acute appendicitis.Asked by EDP to see patient.  Patient was seen in the ED by Dr. Luisa Hart.  It was his opinion she had acute appendicitis he recommended surgery was taken the operating room later that day.  Patient tolerated the procedure well.  Following a.m. she has some soreness but otherwise was doing well.  Port sites all look good.  She was mobilized, she was tolerating a regular diet and was ready for discharge on the first postoperative day.  She did not require further antibiotic coverage.  She will follow-up in our office in approximately 2 weeks.  Condition on discharge: Improved  CBC Latest Ref Rng & Units 02/15/2018 02/14/2018 01/15/2014  WBC 4.0 - 10.5 K/uL 13.1(H) 16.2(H) 12.6(H)  Hemoglobin 12.0 - 15.0 g/dL 10.7(L) 12.3 13.0  Hematocrit 36.0 - 46.0 % 31.6(L) 36.3 37.1  Platelets 150 - 400 K/uL 311 319 299   CMP Latest Ref Rng & Units 02/15/2018 02/14/2018 01/15/2014  Glucose 65 - 99 mg/dL 409(W) 119(J) 478(G)  BUN 6 - 20 mg/dL 5(L) 7 8  Creatinine 9.56 - 1.00 mg/dL 2.13 0.86 5.78  Sodium 135 - 145 mmol/L 140 138 137  Potassium 3.5 - 5.1 mmol/L 3.8 3.4(L) 3.1(L)  Chloride 101 - 111 mmol/L 106 103 98  CO2 22 - 32 mmol/L 26 24 20   Calcium 8.9 - 10.3 mg/dL 4.6(N)  9.4 9.3  Total Protein 6.5 - 8.1 g/dL - 7.0 -  Total Bilirubin 0.3 - 1.2 mg/dL - 0.6 -  Alkaline Phos 38 - 126 U/L - 56 -  AST 15 - 41 U/L - 28 -  ALT 14 - 54 U/L - 29 -   Appendix, Other than Incidental - ACUTE APPENDICITIS WITH SEROSITIS  Disposition: Home  Allergies as of 02/15/2018   No Known Allergies     Medication List    STOP taking these medications   oxyCODONE-acetaminophen 5-325 MG tablet Commonly known as:  PERCOCET     TAKE these medications   acetaminophen 325 MG tablet Commonly known as:  TYLENOL Take 2 tablets (650 mg total) by mouth every 6 (six) hours as needed for mild pain, moderate pain, fever or headache (can take with OXycodone or without oxycodone depending on pain level). What changed:    medication strength  how much to take  reasons to take this   Biotin 5000 MCG Caps Take 5,000 mcg by mouth daily.   cyanocobalamin 500 MCG tablet Take 500 mcg by mouth daily.   naproxen sodium 220 MG tablet Commonly known as:  ALEVE You can take 1-2 tablets every 12 hours as needed for pain.  You can alternate this with plain Tylenol or the oxycodone.  You can buy this over-the-counter at any drugstore. What changed:    how much to take  how to take this  when  to take this  reasons to take this  additional instructions   oxyCODONE 5 MG immediate release tablet Commonly known as:  Oxy IR/ROXICODONE Take 1-2 tablets (5-10 mg total) by mouth every 4 (four) hours as needed for moderate pain.   Vitamin D (Ergocalciferol) 50000 units Caps capsule Commonly known as:  DRISDOL Take 50,000 Units by mouth every Tuesday.      Follow-up Information    [facility].   Specialty:  General Surgery Why:  The office will call you with a follow-up appointment and our [facility] clinic.  If you do not hear in the next 24 hours call and ask for follow-up at the [facility] clinic.  Be at the office 30 minutes early for her appointment.  Bring a Building services engineerphoto ID  and insurance infor Contact information: [address] [place] [address] [phone]           Signed: Sherrie GeorgeJENNINGS,Stillman Buenger 02/16/2018, 1:31 PM

## 2018-11-03 DIAGNOSIS — F331 Major depressive disorder, recurrent, moderate: Secondary | ICD-10-CM | POA: Diagnosis not present

## 2018-11-03 DIAGNOSIS — G4700 Insomnia, unspecified: Secondary | ICD-10-CM | POA: Diagnosis not present

## 2018-11-03 DIAGNOSIS — Z13220 Encounter for screening for lipoid disorders: Secondary | ICD-10-CM | POA: Diagnosis not present

## 2018-11-03 DIAGNOSIS — F411 Generalized anxiety disorder: Secondary | ICD-10-CM | POA: Diagnosis not present

## 2018-11-03 DIAGNOSIS — Z0000 Encounter for general adult medical examination without abnormal findings: Secondary | ICD-10-CM | POA: Diagnosis not present

## 2018-11-03 DIAGNOSIS — Z131 Encounter for screening for diabetes mellitus: Secondary | ICD-10-CM | POA: Diagnosis not present

## 2018-11-03 DIAGNOSIS — E559 Vitamin D deficiency, unspecified: Secondary | ICD-10-CM | POA: Diagnosis not present

## 2019-02-12 IMAGING — CT CT ABD-PELV W/ CM
2 of 4 series · 17 of 46 positions shown, 19 images · IV contrast (Omni 300)
Comparison: None

CLINICAL DATA: Abdominal pain, nausea, and vomiting for 3 days

EXAM:
CT ABDOMEN AND PELVIS WITH CONTRAST
TECHNIQUE: Multidetector CT imaging of the abdomen and pelvis was performed
using the standard protocol following bolus administration of
intravenous contrast. Sagittal and coronal MPR images reconstructed
from axial data set.
CONTRAST:  100mL 7OYWWL-UJJ IOPAMIDOL (7OYWWL-UJJ) INJECTION 61% IV.
No oral contrast.

[Series 3: a/p w/ 5mm · axial · 0.96mm/px · z∈[+811,+1241]mm · 14 of 94 slices shown, 16 images]
[im 4/94  soft-tissue]
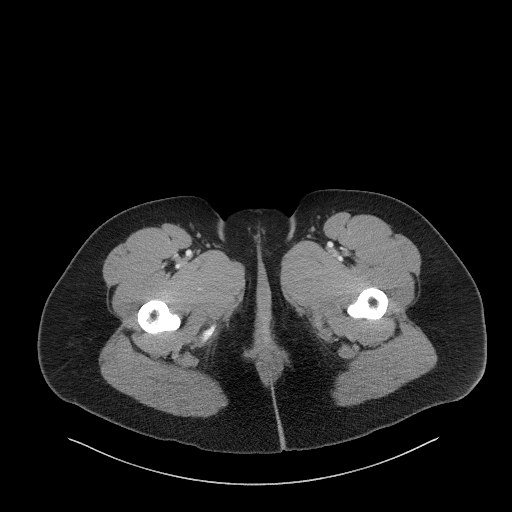
[im 4/94  bone]
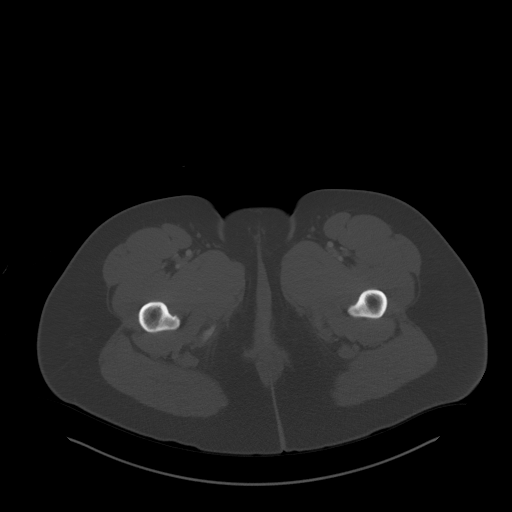
[im 12/94  soft-tissue]
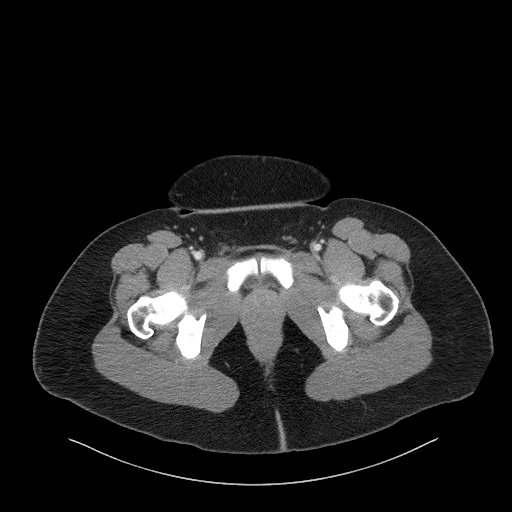
[im 20/94  soft-tissue]
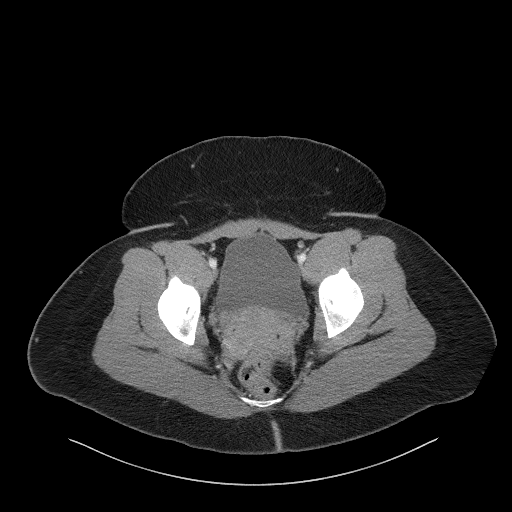
[im 24/94  soft-tissue]
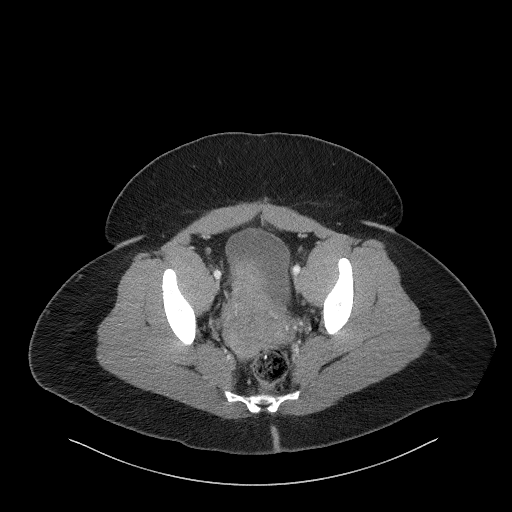
[im 32/94  soft-tissue]
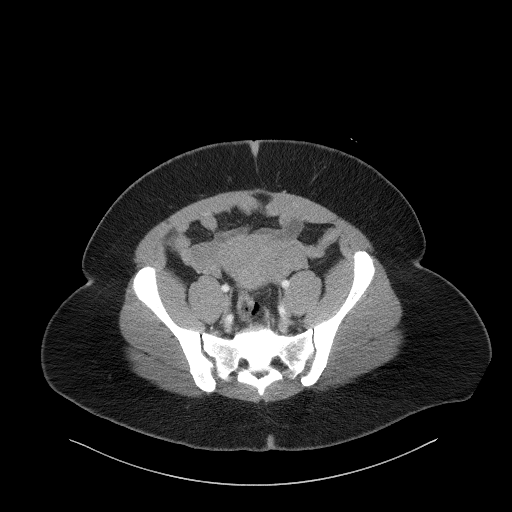
[im 39/94  soft-tissue]
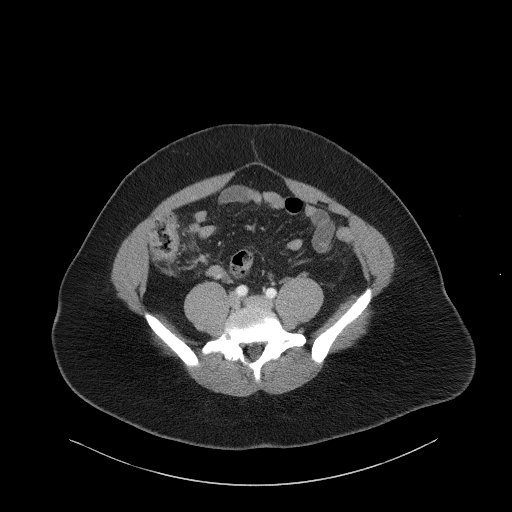
[im 43/94  soft-tissue]
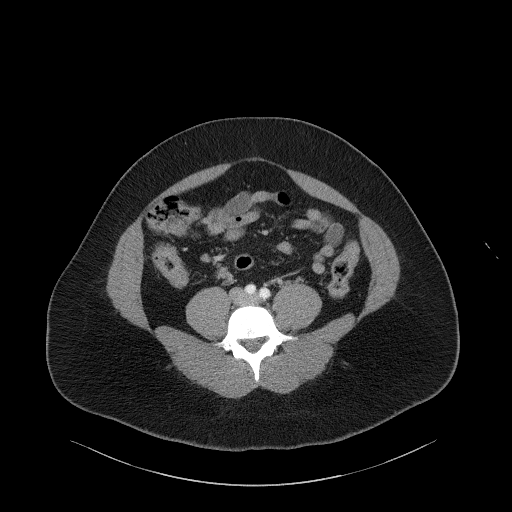
[im 51/94  soft-tissue]
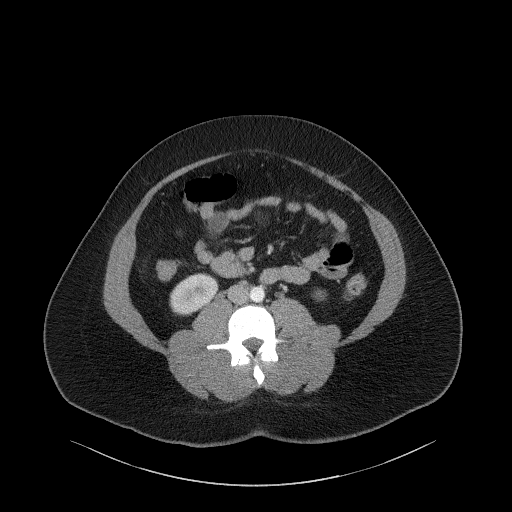
[im 55/94  soft-tissue]
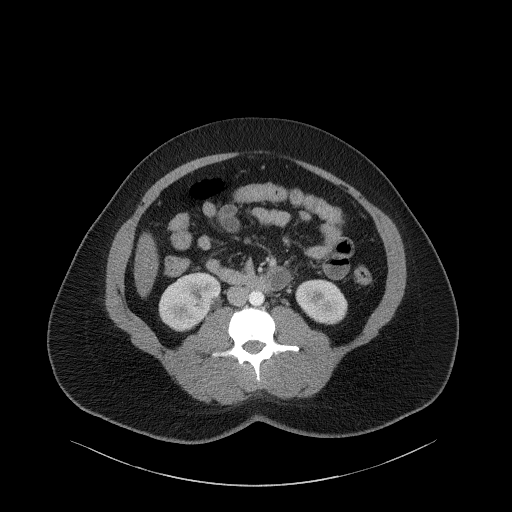
[im 55/94  bone]
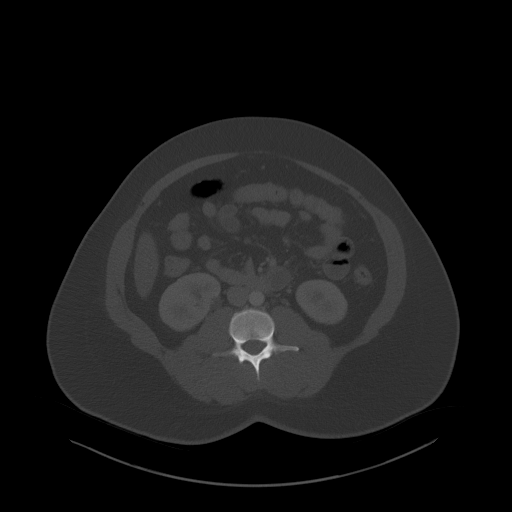
[im 63/94  soft-tissue]
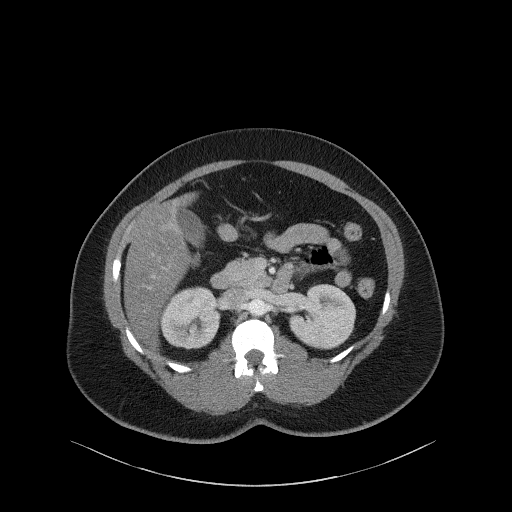
[im 70/94  soft-tissue]
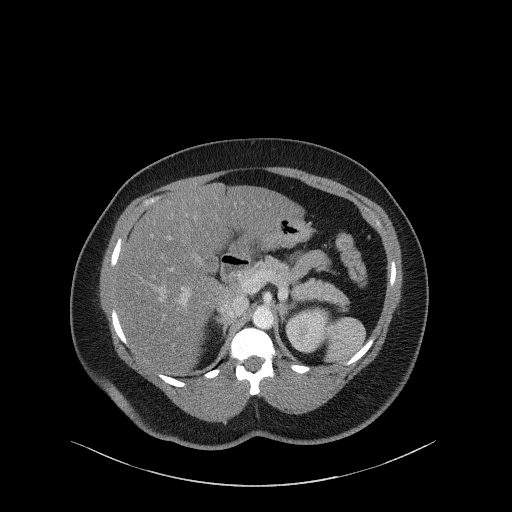
[im 74/94  soft-tissue]
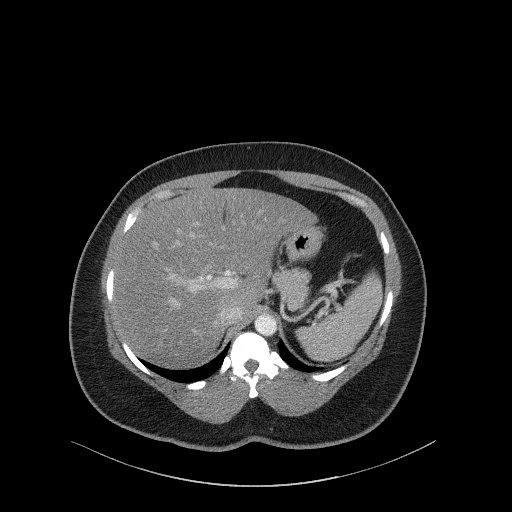
[im 82/94  soft-tissue]
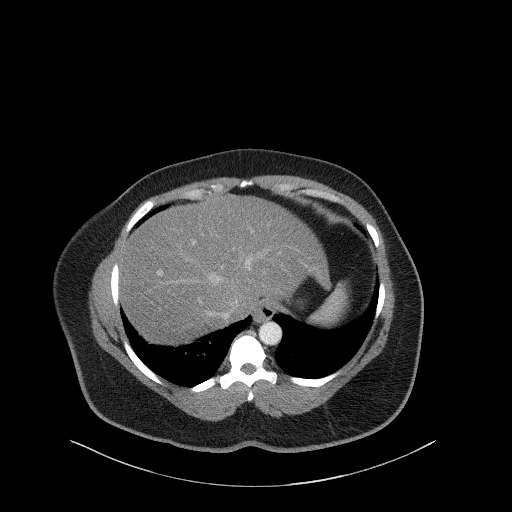
[im 90/94  soft-tissue]
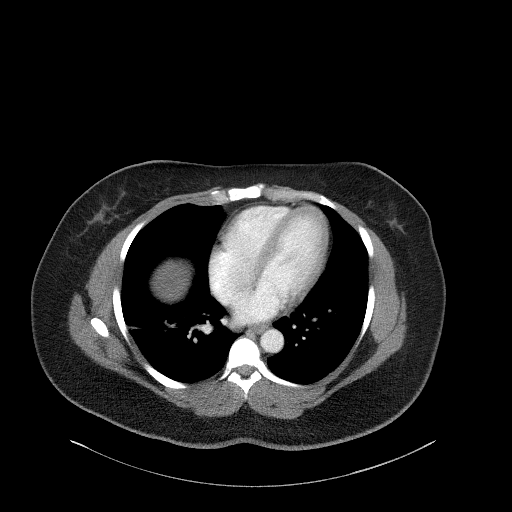

[Series 6: a/p w/ cor · coronal · 0.88mm/px · 3 of 184 slices shown]
[im 62/184  soft-tissue]
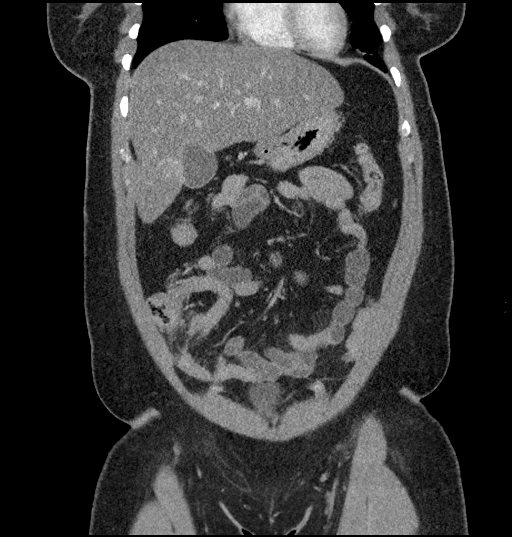
[im 82/184  soft-tissue]
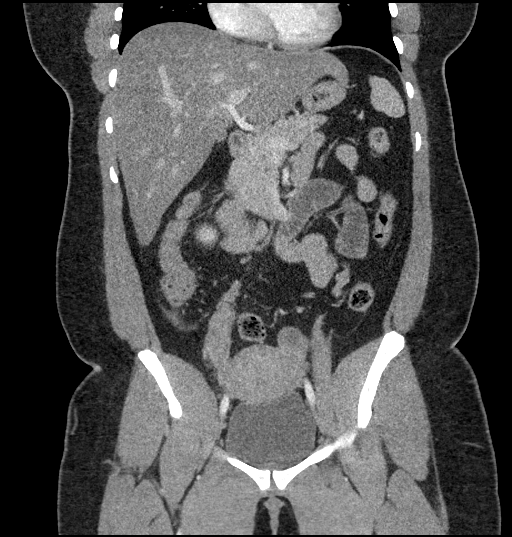
[im 102/184  soft-tissue]
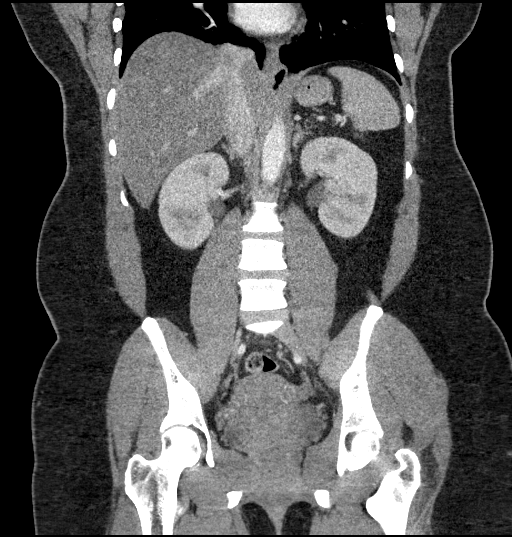

[17 of 46 positions shown; findings below may reference images not displayed]

FINDINGS: Lower chest: Linear subsegmental atelectasis RIGHT lower lobe.

Hepatobiliary: Diffuse fatty infiltration of liver. Mild focal
sparing adjacent to gallbladder fossa. Gallbladder and liver
otherwise normal appearance.

Pancreas: Normal appearance

Spleen: Normal appearance

Adrenals/Urinary Tract: Adrenal glands, kidneys, ureters, and
bladder normal appearance

Stomach/Bowel: Stomach and bowel loops normal appearance. Distal
acute appendicitis:

Appendix: Location: Medial and inferior to cecal tip

Diameter: 10 mm

Appendicolith: None

Mucosal hyper-enhancement: Minimal

Extraluminal gas: None

Periappendiceal collection: None

Vascular/Lymphatic: Unremarkable

Reproductive: Normal appearance

Other: No free air or free fluid.

Musculoskeletal: Normal appearance
IMPRESSION: Acute uncomplicated appendicitis.

## 2019-02-15 DIAGNOSIS — R300 Dysuria: Secondary | ICD-10-CM | POA: Diagnosis not present

## 2019-02-15 DIAGNOSIS — N390 Urinary tract infection, site not specified: Secondary | ICD-10-CM | POA: Diagnosis not present

## 2019-04-15 DIAGNOSIS — R0602 Shortness of breath: Secondary | ICD-10-CM | POA: Diagnosis not present

## 2019-04-15 DIAGNOSIS — R05 Cough: Secondary | ICD-10-CM | POA: Diagnosis not present

## 2019-04-15 DIAGNOSIS — J3089 Other allergic rhinitis: Secondary | ICD-10-CM | POA: Diagnosis not present

## 2019-04-19 DIAGNOSIS — R05 Cough: Secondary | ICD-10-CM | POA: Diagnosis not present

## 2019-04-19 DIAGNOSIS — R0602 Shortness of breath: Secondary | ICD-10-CM | POA: Diagnosis not present

## 2019-04-21 DIAGNOSIS — R350 Frequency of micturition: Secondary | ICD-10-CM | POA: Diagnosis not present

## 2019-04-21 DIAGNOSIS — R0602 Shortness of breath: Secondary | ICD-10-CM | POA: Diagnosis not present

## 2019-04-21 DIAGNOSIS — K1370 Unspecified lesions of oral mucosa: Secondary | ICD-10-CM | POA: Diagnosis not present

## 2019-04-21 DIAGNOSIS — R300 Dysuria: Secondary | ICD-10-CM | POA: Diagnosis not present

## 2019-04-21 DIAGNOSIS — R635 Abnormal weight gain: Secondary | ICD-10-CM | POA: Diagnosis not present

## 2019-08-08 DIAGNOSIS — R591 Generalized enlarged lymph nodes: Secondary | ICD-10-CM | POA: Diagnosis not present

## 2019-08-08 DIAGNOSIS — J029 Acute pharyngitis, unspecified: Secondary | ICD-10-CM | POA: Diagnosis not present

## 2019-11-04 ENCOUNTER — Other Ambulatory Visit: Payer: Self-pay

## 2019-11-04 DIAGNOSIS — Z20822 Contact with and (suspected) exposure to covid-19: Secondary | ICD-10-CM

## 2019-11-06 LAB — NOVEL CORONAVIRUS, NAA: SARS-CoV-2, NAA: NOT DETECTED

## 2019-11-27 ENCOUNTER — Encounter (HOSPITAL_COMMUNITY): Payer: Self-pay | Admitting: *Deleted

## 2019-11-27 ENCOUNTER — Other Ambulatory Visit: Payer: Self-pay

## 2019-11-27 ENCOUNTER — Emergency Department (HOSPITAL_COMMUNITY)
Admission: EM | Admit: 2019-11-27 | Discharge: 2019-11-28 | Disposition: A | Discharge status: Home / Self Care | Payer: Medicaid Other | Attending: Emergency Medicine | Admitting: Emergency Medicine

## 2019-11-27 DIAGNOSIS — Z20822 Contact with and (suspected) exposure to covid-19: Secondary | ICD-10-CM | POA: Insufficient documentation

## 2019-11-27 DIAGNOSIS — R509 Fever, unspecified: Secondary | ICD-10-CM | POA: Insufficient documentation

## 2019-11-27 DIAGNOSIS — Z5321 Procedure and treatment not carried out due to patient leaving prior to being seen by health care provider: Secondary | ICD-10-CM | POA: Insufficient documentation

## 2019-11-27 LAB — COMPREHENSIVE METABOLIC PANEL
ALT: 34 U/L (ref 0–44)
AST: 23 U/L (ref 15–41)
Albumin: 4.1 g/dL (ref 3.5–5.0)
Alkaline Phosphatase: 50 U/L (ref 38–126)
Anion gap: 12 (ref 5–15)
BUN: 7 mg/dL (ref 6–20)
CO2: 20 mmol/L — ABNORMAL LOW (ref 22–32)
Calcium: 9.7 mg/dL (ref 8.9–10.3)
Chloride: 103 mmol/L (ref 98–111)
Creatinine, Ser: 1.12 mg/dL — ABNORMAL HIGH (ref 0.44–1.00)
GFR calc Af Amer: >60 mL/min (ref >60)
GFR calc non Af Amer: >60 mL/min (ref >60)
Glucose, Bld: 137 mg/dL — ABNORMAL HIGH (ref 70–99)
Potassium: 3.5 mmol/L (ref 3.5–5.1)
Sodium: 135 mmol/L (ref 135–145)
Total Bilirubin: 0.8 mg/dL (ref 0.3–1.2)
Total Protein: 7.6 g/dL (ref 6.5–8.1)

## 2019-11-27 LAB — CBC
HCT: 36 % (ref 36.0–46.0)
Hemoglobin: 12.5 g/dL (ref 12.0–15.0)
MCH: 29.8 pg (ref 26.0–34.0)
MCHC: 34.7 g/dL (ref 30.0–36.0)
MCV: 85.9 fL (ref 80.0–100.0)
Platelets: 340 10*3/uL (ref 150–400)
RBC: 4.19 MIL/uL (ref 3.87–5.11)
RDW: 11.9 % (ref 11.5–15.5)
WBC: 16.4 10*3/uL — ABNORMAL HIGH (ref 4.0–10.5)
nRBC: 0 % (ref 0.0–0.2)

## 2019-11-27 LAB — URINALYSIS, ROUTINE W REFLEX MICROSCOPIC
Bilirubin Urine: NEGATIVE
Glucose, UA: NEGATIVE mg/dL
Ketones, ur: NEGATIVE mg/dL
Nitrite: NEGATIVE
Protein, ur: NEGATIVE mg/dL
Specific Gravity, Urine: 1.009 (ref 1.005–1.030)
pH: 9 — ABNORMAL HIGH (ref 5.0–8.0)

## 2019-11-27 LAB — LIPASE, BLOOD: Lipase: 18 U/L (ref 11–51)

## 2019-11-27 LAB — I-STAT BETA HCG BLOOD, ED (MC, WL, AP ONLY): I-stat hCG, quantitative: <5 m[IU]/mL (ref <5)

## 2019-11-27 LAB — POC SARS CORONAVIRUS 2 AG -  ED: SARS Coronavirus 2 Ag: NEGATIVE

## 2019-11-27 MED ORDER — SODIUM CHLORIDE 0.9% FLUSH: 3.0000 mL | Freq: Once | INTRAVENOUS | Status: DC

## 2019-11-27 MED ORDER — ONDANSETRON 4 MG PO TBDP
4.0000 mg | ORAL_TABLET | Freq: Once | ORAL | Status: AC | PRN
  Administered 2019-11-27: 4 mg via ORAL
  Filled 2019-11-27: qty 1

## 2019-11-27 NOTE — ED Triage Notes (Signed)
Pt arrived with lower abdominal pain and urinary frequency that started on Thursday and then started having body aches, cough and nausea yesterday.  Pt states her daughter was positive for COVID and she wants to be checked.

## 2019-11-28 DIAGNOSIS — R350 Frequency of micturition: Secondary | ICD-10-CM | POA: Diagnosis not present

## 2019-11-28 NOTE — ED Notes (Addendum)
Pt said that she will be leaving and was seen leaving through front door.

## 2020-02-02 ENCOUNTER — Other Ambulatory Visit: Payer: Self-pay | Admitting: Family Medicine

## 2020-02-02 DIAGNOSIS — J309 Allergic rhinitis, unspecified: Secondary | ICD-10-CM | POA: Diagnosis not present

## 2020-02-02 DIAGNOSIS — E782 Mixed hyperlipidemia: Secondary | ICD-10-CM | POA: Diagnosis not present

## 2020-02-02 DIAGNOSIS — Z0000 Encounter for general adult medical examination without abnormal findings: Secondary | ICD-10-CM | POA: Diagnosis not present

## 2020-02-02 DIAGNOSIS — Z1231 Encounter for screening mammogram for malignant neoplasm of breast: Secondary | ICD-10-CM

## 2020-02-02 DIAGNOSIS — E559 Vitamin D deficiency, unspecified: Secondary | ICD-10-CM | POA: Diagnosis not present

## 2020-02-02 DIAGNOSIS — R7303 Prediabetes: Secondary | ICD-10-CM | POA: Diagnosis not present

## 2020-02-27 ENCOUNTER — Other Ambulatory Visit: Payer: Self-pay

## 2020-02-27 ENCOUNTER — Ambulatory Visit
Admission: RE | Admit: 2020-02-27 | Discharge: 2020-02-27 | Disposition: A | Discharge status: Home / Self Care | Payer: Self-pay | Source: Ambulatory Visit | Attending: Family Medicine | Admitting: Family Medicine

## 2020-02-27 DIAGNOSIS — Z1231 Encounter for screening mammogram for malignant neoplasm of breast: Secondary | ICD-10-CM

## 2020-05-17 DIAGNOSIS — R109 Unspecified abdominal pain: Secondary | ICD-10-CM | POA: Diagnosis not present

## 2020-05-17 DIAGNOSIS — K219 Gastro-esophageal reflux disease without esophagitis: Secondary | ICD-10-CM | POA: Diagnosis not present

## 2020-06-01 DIAGNOSIS — R112 Nausea with vomiting, unspecified: Secondary | ICD-10-CM | POA: Diagnosis not present

## 2020-06-01 DIAGNOSIS — R109 Unspecified abdominal pain: Secondary | ICD-10-CM | POA: Diagnosis not present

## 2022-03-10 ENCOUNTER — Other Ambulatory Visit: Payer: Self-pay | Admitting: Family Medicine

## 2022-03-10 DIAGNOSIS — Z1231 Encounter for screening mammogram for malignant neoplasm of breast: Secondary | ICD-10-CM

## 2022-03-14 ENCOUNTER — Encounter: Payer: Self-pay | Admitting: Plastic Surgery

## 2022-03-14 ENCOUNTER — Ambulatory Visit: Payer: BC Managed Care – PPO | Admitting: Plastic Surgery

## 2022-03-14 VITALS — BP 110/81 | HR 81 | Ht 62.0 in | Wt 139.8 lb

## 2022-03-14 DIAGNOSIS — M546 Pain in thoracic spine: Secondary | ICD-10-CM

## 2022-03-14 DIAGNOSIS — M793 Panniculitis, unspecified: Secondary | ICD-10-CM

## 2022-03-14 DIAGNOSIS — G8929 Other chronic pain: Secondary | ICD-10-CM

## 2022-03-14 NOTE — Progress Notes (Signed)
? ?Referring Provider ?Soundra Pilon, FNP ?[address] ?[place],  [place] [address]  ? ?CC:  ?Excess skin abdomen ? ?Jacqueline Carter is an 41 y.o. female.  ?HPI: 41 year old with excess skin of the abdomen.  She is interested in having removal.  She is most concerned with her rashes.  She uses Vaseline to help with her irritation that occurs under her pannus.  She has had 3 prior C-sections and a laparoscopic appendectomy.  She does not use any tobacco products.  She does not have a history of diabetes.  Her weight loss was 75 pounds from diet and exercise. ? ?No Known Allergies ? ?Outpatient Encounter Medications as of 03/14/2022  ?Medication Sig  ? Biotin 5000 MCG CAPS Take 5,000 mcg by mouth daily.  ? cyanocobalamin 500 MCG tablet Take 500 mcg by mouth daily.  ? OVER THE COUNTER MEDICATION Magnesium-Take 1 tablet daily.  ? naproxen sodium (ALEVE) 220 MG tablet You can take 1-2 tablets every 12 hours as needed for pain.  You can alternate this with plain Tylenol or the oxycodone.  You can buy this over-the-counter at any drugstore.  ? [DISCONTINUED] acetaminophen (TYLENOL) 325 MG tablet Take 2 tablets (650 mg total) by mouth every 6 (six) hours as needed for mild pain, moderate pain, fever or headache (can take with OXycodone or without oxycodone depending on pain level).  ? [DISCONTINUED] oxyCODONE (OXY IR/ROXICODONE) 5 MG immediate release tablet Take 1-2 tablets (5-10 mg total) by mouth every 4 (four) hours as needed for moderate pain.  ? [DISCONTINUED] Vitamin D, Ergocalciferol, (DRISDOL) 50000 units CAPS capsule Take 50,000 Units by mouth every Tuesday.  ? ?No facility-administered encounter medications on file as of 03/14/2022.  ?  ? ?Past Medical History:  ?Diagnosis Date  ? Complication of anesthesia   ? required re-dosing of epidural with 2008 delivery  ? Diabetes mellitus   ? Gestational diabetes   ? PONV (postoperative nausea and vomiting)   ? ? ?Past Surgical History:  ?Procedure Laterality  Date  ? CESAREAN SECTION    ? CESAREAN SECTION  09/25/2012  ? Procedure: CESAREAN SECTION;  Surgeon: Freddrick March. Tenny Craw, MD;  Location: [facility];  Service: Obstetrics;  Laterality: N/A;  Repeat cesarean section of baby boy  at 29 ? APGAR 9/9  ? LAPAROSCOPIC APPENDECTOMY N/A 02/14/2018  ? Procedure: APPENDECTOMY LAPAROSCOPIC;  Surgeon: Harriette Bouillon, MD;  Location: [facility] OR;  Service: General;  Laterality: N/A;  ? ? ?Family History  ?Problem Relation Age of Onset  ? Other Neg Hx   ? ? ?Social History  ? ?Social History Narrative  ? Not on file  ?  ? ?Review of Systems ?General: Denies fevers, chills, weight loss ?CV: Denies chest pain, shortness of breath, palpitations ? ? ?Physical Exam ? ?  03/14/2022  ? 10:06 AM 11/28/2019  ?  4:28 AM 11/28/2019  ? 12:30 AM  ?Vitals with BMI  ?Height 5\' 2"     ?Weight 139 lbs 13 oz    ?BMI 25.56    ?Systolic 110 130  ?Diastolic 81 77 95  ?Pulse 81 99 100  ?  ?General:  No acute distress,  Alert and oriented, Non-Toxic, Normal speech and affect ?Abdomen: Significant excess above and below the umbilicus.  Possibly still some intra-abdominal fat despite her significant weight loss. ? ?Assessment/Plan ?Patient is a good candidate for functional panniculectomy to improve her rashes.  I think there is a good chance she will have benefit.  She could have additional  benefit from transposition of her umbilicus and plication.  She is aware that transposition of the umbilicus and plication is typically not covered by insurance. ? ?Jacqueline Carter ?03/14/2022, 11:09 AM  ? ? ?  ?

## 2022-03-20 ENCOUNTER — Telehealth: Payer: Self-pay | Admitting: Plastic Surgery

## 2022-03-20 NOTE — Telephone Encounter (Signed)
Tried to submit authorization, but card on file is no longer valid. Called patient to advise we need copy of new insurance card (front and back)so we can obtain authorization. Patient will upload to mychart.  ?

## 2022-04-09 ENCOUNTER — Ambulatory Visit
Admission: RE | Admit: 2022-04-09 | Discharge: 2022-04-09 | Disposition: A | Discharge status: Home / Self Care | Payer: BC Managed Care – PPO | Source: Ambulatory Visit | Attending: Family Medicine | Admitting: Family Medicine

## 2022-04-09 DIAGNOSIS — Z1231 Encounter for screening mammogram for malignant neoplasm of breast: Secondary | ICD-10-CM

## 2022-11-12 ENCOUNTER — Other Ambulatory Visit: Payer: Self-pay | Admitting: Family Medicine

## 2022-11-12 ENCOUNTER — Ambulatory Visit
Admission: RE | Admit: 2022-11-12 | Discharge: 2022-11-12 | Disposition: A | Discharge status: Home / Self Care | Payer: BC Managed Care – PPO | Source: Ambulatory Visit | Attending: Family Medicine | Admitting: Family Medicine

## 2022-11-12 DIAGNOSIS — M25551 Pain in right hip: Secondary | ICD-10-CM

## 2023-02-26 ENCOUNTER — Other Ambulatory Visit: Payer: Self-pay | Admitting: Family Medicine

## 2023-02-26 DIAGNOSIS — Z1231 Encounter for screening mammogram for malignant neoplasm of breast: Secondary | ICD-10-CM

## 2023-04-13 ENCOUNTER — Ambulatory Visit
Admission: RE | Admit: 2023-04-13 | Discharge: 2023-04-13 | Disposition: A | Discharge status: Home / Self Care | Payer: BC Managed Care – PPO | Source: Ambulatory Visit | Attending: Family Medicine | Admitting: Family Medicine

## 2023-04-13 DIAGNOSIS — Z1231 Encounter for screening mammogram for malignant neoplasm of breast: Secondary | ICD-10-CM

## 2024-04-08 ENCOUNTER — Other Ambulatory Visit: Payer: Self-pay | Admitting: Family Medicine

## 2024-04-08 DIAGNOSIS — Z1231 Encounter for screening mammogram for malignant neoplasm of breast: Secondary | ICD-10-CM

## 2024-04-14 ENCOUNTER — Ambulatory Visit
Admission: RE | Admit: 2024-04-14 | Discharge: 2024-04-14 | Disposition: A | Discharge status: Home / Self Care | Payer: Self-pay | Source: Ambulatory Visit | Attending: Family Medicine | Admitting: Family Medicine

## 2024-04-14 DIAGNOSIS — Z1231 Encounter for screening mammogram for malignant neoplasm of breast: Secondary | ICD-10-CM

## 2024-08-02 ENCOUNTER — Emergency Department (HOSPITAL_BASED_OUTPATIENT_CLINIC_OR_DEPARTMENT_OTHER): Payer: Self-pay

## 2024-08-02 ENCOUNTER — Emergency Department (HOSPITAL_BASED_OUTPATIENT_CLINIC_OR_DEPARTMENT_OTHER)
Admission: EM | Admit: 2024-08-02 | Discharge: 2024-08-03 | Disposition: A | Discharge status: Home / Self Care | Payer: Self-pay | Attending: Emergency Medicine | Admitting: Emergency Medicine

## 2024-08-02 ENCOUNTER — Other Ambulatory Visit: Payer: Self-pay

## 2024-08-02 ENCOUNTER — Encounter (HOSPITAL_BASED_OUTPATIENT_CLINIC_OR_DEPARTMENT_OTHER): Payer: Self-pay

## 2024-08-02 DIAGNOSIS — R0682 Tachypnea, not elsewhere classified: Secondary | ICD-10-CM | POA: Insufficient documentation

## 2024-08-02 DIAGNOSIS — R350 Frequency of micturition: Secondary | ICD-10-CM | POA: Insufficient documentation

## 2024-08-02 DIAGNOSIS — R209 Unspecified disturbances of skin sensation: Secondary | ICD-10-CM | POA: Insufficient documentation

## 2024-08-02 DIAGNOSIS — R7989 Other specified abnormal findings of blood chemistry: Secondary | ICD-10-CM | POA: Insufficient documentation

## 2024-08-02 DIAGNOSIS — D72829 Elevated white blood cell count, unspecified: Secondary | ICD-10-CM | POA: Insufficient documentation

## 2024-08-02 DIAGNOSIS — A419 Sepsis, unspecified organism: Secondary | ICD-10-CM

## 2024-08-02 DIAGNOSIS — R197 Diarrhea, unspecified: Secondary | ICD-10-CM | POA: Insufficient documentation

## 2024-08-02 DIAGNOSIS — R944 Abnormal results of kidney function studies: Secondary | ICD-10-CM | POA: Insufficient documentation

## 2024-08-02 DIAGNOSIS — N3000 Acute cystitis without hematuria: Secondary | ICD-10-CM

## 2024-08-02 DIAGNOSIS — R112 Nausea with vomiting, unspecified: Secondary | ICD-10-CM | POA: Insufficient documentation

## 2024-08-02 DIAGNOSIS — N390 Urinary tract infection, site not specified: Secondary | ICD-10-CM | POA: Diagnosis present

## 2024-08-02 DIAGNOSIS — R61 Generalized hyperhidrosis: Secondary | ICD-10-CM | POA: Insufficient documentation

## 2024-08-02 DIAGNOSIS — R000 Tachycardia, unspecified: Secondary | ICD-10-CM | POA: Insufficient documentation

## 2024-08-02 LAB — BASIC METABOLIC PANEL WITH GFR
Anion gap: 18 — ABNORMAL HIGH (ref 5–15)
BUN: 9 mg/dL (ref 6–20)
CO2: 15 mmol/L — ABNORMAL LOW (ref 22–32)
Calcium: 10.7 mg/dL — ABNORMAL HIGH (ref 8.9–10.3)
Chloride: 102 mmol/L (ref 98–111)
Creatinine, Ser: 1.22 mg/dL — ABNORMAL HIGH (ref 0.44–1.00)
GFR, Estimated: 56 mL/min — ABNORMAL LOW (ref >60)
Glucose, Bld: 143 mg/dL — ABNORMAL HIGH (ref 70–99)
Potassium: 4.2 mmol/L (ref 3.5–5.1)
Sodium: 136 mmol/L (ref 135–145)

## 2024-08-02 LAB — CBC WITH DIFFERENTIAL/PLATELET
Abs Immature Granulocytes: 0.14 K/uL — ABNORMAL HIGH (ref 0.00–0.07)
Basophils Absolute: 0 K/uL (ref 0.0–0.1)
Basophils Relative: 0 %
Eosinophils Absolute: 0 K/uL (ref 0.0–0.5)
Eosinophils Relative: 0 %
HCT: 33 % — ABNORMAL LOW (ref 36.0–46.0)
Hemoglobin: 11.4 g/dL — ABNORMAL LOW (ref 12.0–15.0)
Immature Granulocytes: 1 %
Lymphocytes Relative: 6 %
Lymphs Abs: 1.2 K/uL (ref 0.7–4.0)
MCH: 30.8 pg (ref 26.0–34.0)
MCHC: 34.5 g/dL (ref 30.0–36.0)
MCV: 89.2 fL (ref 80.0–100.0)
Monocytes Absolute: 1 K/uL (ref 0.1–1.0)
Monocytes Relative: 5 %
Neutro Abs: 17.2 K/uL — ABNORMAL HIGH (ref 1.7–7.7)
Neutrophils Relative %: 88 %
Platelets: 228 K/uL (ref 150–400)
RBC: 3.7 MIL/uL — ABNORMAL LOW (ref 3.87–5.11)
RDW: 11.9 % (ref 11.5–15.5)
WBC: 19.6 K/uL — ABNORMAL HIGH (ref 4.0–10.5)
nRBC: 0 % (ref 0.0–0.2)

## 2024-08-02 LAB — URINALYSIS, ROUTINE W REFLEX MICROSCOPIC
Bilirubin Urine: NEGATIVE
Glucose, UA: NEGATIVE mg/dL
Ketones, ur: 15 mg/dL — AB
Nitrite: POSITIVE — AB
Protein, ur: 30 mg/dL — AB
RBC / HPF: >50 RBC/hpf (ref 0–5)
Specific Gravity, Urine: 1.014 (ref 1.005–1.030)
Trans Epithel, UA: <1
pH: 8 (ref 5.0–8.0)

## 2024-08-02 LAB — LACTIC ACID, PLASMA
Lactic Acid, Venous: 2.4 mmol/L (ref 0.5–1.9)
Lactic Acid, Venous: 1.1 mmol/L (ref 0.5–1.9)

## 2024-08-02 LAB — PREGNANCY, URINE: Preg Test, Ur: NEGATIVE

## 2024-08-02 LAB — RESP PANEL BY RT-PCR (RSV, FLU A&B, COVID)  RVPGX2
Influenza A by PCR: NEGATIVE
Influenza B by PCR: NEGATIVE
Resp Syncytial Virus by PCR: NEGATIVE
SARS Coronavirus 2 by RT PCR: NEGATIVE

## 2024-08-02 MED ORDER — SODIUM CHLORIDE 0.9 % IV SOLN
2.0000 g | Freq: Once | INTRAVENOUS | Status: AC
  Administered 2024-08-02: 2 g via INTRAVENOUS
  Filled 2024-08-02: qty 20

## 2024-08-02 MED ORDER — SODIUM CHLORIDE 0.9 % IV BOLUS
1000.0000 mL | Freq: Once | INTRAVENOUS | Status: AC
  Administered 2024-08-02: 1000 mL via INTRAVENOUS

## 2024-08-02 MED ORDER — ONDANSETRON 4 MG PO TBDP
4.0000 mg | ORAL_TABLET | Freq: Once | ORAL | Status: AC
  Administered 2024-08-02: 4 mg via ORAL
  Filled 2024-08-02: qty 1

## 2024-08-02 MED ORDER — IBUPROFEN 400 MG PO TABS
600.0000 mg | ORAL_TABLET | Freq: Once | ORAL | Status: AC
  Administered 2024-08-02: 600 mg via ORAL
  Filled 2024-08-02: qty 1

## 2024-08-02 MED ORDER — IOHEXOL 300 MG/ML  SOLN
100.0000 mL | Freq: Once | INTRAMUSCULAR | Status: AC | PRN
  Administered 2024-08-02: 100 mL via INTRAVENOUS

## 2024-08-02 NOTE — Progress Notes (Signed)
 Hospitalist Transfer Note:    Nursing staff, Please call [facility] Admits & Consults System-Wide number on Amion [phone]) as soon as patient's arrival, so appropriate admitting provider can evaluate the pt.   Transferring facility: [facility] Requesting provider: Dr. Pamella (EDP at [facility]) Reason for transfer: admission for further evaluation and management of severe sepsis due to acute cystitis.   43 year old female with history of recurrent urinary tract infections, chronic leukocytosis, who presented to [facility] ED complaining of new onset lower abdominal discomfort over the last few days, associated with subjective fever, chills, generalized myalgias.   Vital signs in the ED were notable for the following: Temperature max 99.1; initial heart rates in the 130s, subsequently decreasing into the 90s following initiation of IV antibiotics and IV fluids; systolic blood pressures in the 1 teens to 130s.   Labs were notable for urinalysis, that was reported to be consistent with urinary tract infection.  CBC notable for white blood cell count 19,600 with 88% neutrophils compared to most recent prior white blood cell count of 16,400 in January 2021.  Initial lactic acid 2.4, with repeat value trending down to 1.1.  Imaging notable for CT abdomen/pelvis showed no evidence of acute process, including no evidence of acute pyelonephritis.  Medications administered prior to transfer included the following: Rocephin , NS x 1 L bolus.  Subsequently, I accepted this patient for transfer for observation to a med/tele bed at [facility] for further work-up and management of the above.      Eva Pore, DO Hospitalist

## 2024-08-02 NOTE — ED Provider Notes (Signed)
 [place] [facility]ERGENC[facility] DEPARTMENT AT [facility] Provider Note   CSN: [id number] Arrival date & time: 08/02/24  1921     Patient presents with: Generalized Body Aches   Jacqueline Carter is a 43 y.o. female.   Patient reports noticing burning sensation to right thigh yesterday.  Overnight reports some generalized muscle aches and emesis x 1 with ongoing nausea.  Feels better after receiving Zofran .  Unsure if she has had fever, has not taken temperature.  Does report chills and sweats.  States daughter had a cold recently.  Denies cough, congestion, sore throat.  Has tried to stay hydrated but has not been eating much in the last 24 hours given nausea.  Endorses urinary frequency but no dysuria.  Had an episode of diarrhea since arriving to the ED.  Denies chest pain and shortness of breath.        Prior to Admission medications   Medication Sig Start Date End Date Taking? Authorizing Provider  Biotin 5000 MCG CAPS Take 5,000 mcg by mouth daily.    [provider]  cyanocobalamin  500 MCG tablet Take 500 mcg by mouth daily.    [provider]  naproxen  sodium (ALEVE ) 220 MG tablet You can take 1-2 tablets every 12 hours as needed for pain.  You can alternate this with plain Tylenol  or the oxycodone .  You can buy this over-the-counter at any drugstore. 02/15/18   Tonnie George, PA-C  OVER THE COUNTER MEDICATION Magnesium-Take 1 tablet daily.    [provider]    Allergies: Patient has no known allergies.    Review of Systems  Updated Vital Signs BP (!) 133/105 (BP Location: Right Arm)   Pulse (!) 130   Temp 99.1 F (37.3 C) (Oral)   Resp 18   SpO2 100%   Physical Exam Constitutional:      Appearance: Normal appearance. She is ill-appearing.  HENT:     Head: Normocephalic and atraumatic.     Nose: Nose normal. No rhinorrhea.     Mouth/Throat:     Mouth: Mucous membranes are moist.  Eyes:     Extraocular Movements: Extraocular  movements intact.     Conjunctiva/sclera: Conjunctivae normal.  Cardiovascular:     Rate and Rhythm: Regular rhythm. Tachycardia present.     Heart sounds: Normal heart sounds.  Pulmonary:     Breath sounds: Normal breath sounds.     Comments: Tachypneic Abdominal:     General: Bowel sounds are normal. There is no distension.     Palpations: Abdomen is soft.     Tenderness: There is no abdominal tenderness.  Musculoskeletal:        General: Normal range of motion.     Cervical back: Normal range of motion.  Skin:    General: Skin is warm and dry.     Findings: No lesion.  Neurological:     General: No focal deficit present.     Mental Status: She is alert.  Psychiatric:        Mood and Affect: Mood normal.        Behavior: Behavior normal.     (all labs ordered are listed, but only abnormal results are displayed) Labs Reviewed  CBC WITH DIFFERENTIAL/PLATELET - Abnormal; Notable for the following components:      Result Value   WBC 19.6 (*)    RBC 3.70 (*)    Hemoglobin 11.4 (*)    HCT 33.0 (*)    Neutro Abs 17.2 (*)  Abs Immature Granulocytes 0.14 (*)    All other components within normal limits  RESP PANEL BY RT-PCR (RSV, FLU A&B, COVID)  RVPGX2  BASIC METABOLIC PANEL WITH GFR  URINALYSIS, ROUTINE W REFLEX MICROSCOPIC    EKG: None  Radiology: No results found.   Procedures   Medications Ordered in the ED  ondansetron  (ZOFRAN -ODT) disintegrating tablet 4 mg (4 mg Oral Given 08/02/24 1957)                                    Medical Decision Making Patient with less than 24 hours of generalized body aches, nausea/vomiting, and possible fevers.  Tachycardic and tachypneic with leukocytosis to 19.6.  Creatinine elevated to 1.22, unclear baseline.  Will initiate sepsis workup with lactic acid, blood cultures, CXR, IVF bolus, empiric antibiotics with ceftriaxone .  CXR shows no acute abnormality. UA shows evidence of UTI with positive nitrites, leukocytes.   Lactic acid elevated to 2.4.  Will obtain CTAP given presentation with sepsis.  CTAP showed no evidence of pyelonephritis or other acute abdominal or pelvic pathology.  However, given that patient met SIRS criteria on presentation, will admit for ongoing monitoring and treatment with antibiotics/fluids.  Will sign out to admitting hospitalist.  Amount and/or Complexity of Data Reviewed Labs: ordered. Radiology: ordered.  Risk Prescription drug management. Decision regarding hospitalization.       Final diagnoses:  None    ED Discharge Orders     None          Adele Song, MD 08/02/24 2338    Pamella Ozell LABOR, DO 08/11/24 0845

## 2024-08-02 NOTE — ED Triage Notes (Signed)
 Pt presents via POV c/o body aches and legs burning since yesterday. Reports some emesis. Reports shaking and chills.

## 2024-08-03 ENCOUNTER — Encounter (HOSPITAL_BASED_OUTPATIENT_CLINIC_OR_DEPARTMENT_OTHER): Payer: Self-pay | Admitting: Emergency Medicine

## 2024-08-03 ENCOUNTER — Inpatient Hospital Stay (HOSPITAL_BASED_OUTPATIENT_CLINIC_OR_DEPARTMENT_OTHER)
Admission: EM | Admit: 2024-08-03 | Discharge: 2024-08-06 | DRG: 872 | Disposition: A | Discharge status: Home / Self Care | Payer: Self-pay | Attending: Internal Medicine | Admitting: Internal Medicine

## 2024-08-03 DIAGNOSIS — A419 Sepsis, unspecified organism: Principal | ICD-10-CM | POA: Diagnosis present

## 2024-08-03 DIAGNOSIS — Z1152 Encounter for screening for COVID-19: Secondary | ICD-10-CM

## 2024-08-03 DIAGNOSIS — N898 Other specified noninflammatory disorders of vagina: Secondary | ICD-10-CM | POA: Diagnosis present

## 2024-08-03 DIAGNOSIS — R6520 Severe sepsis without septic shock: Secondary | ICD-10-CM | POA: Diagnosis present

## 2024-08-03 DIAGNOSIS — Z9049 Acquired absence of other specified parts of digestive tract: Secondary | ICD-10-CM

## 2024-08-03 DIAGNOSIS — E876 Hypokalemia: Secondary | ICD-10-CM | POA: Diagnosis present

## 2024-08-03 DIAGNOSIS — N390 Urinary tract infection, site not specified: Secondary | ICD-10-CM | POA: Diagnosis present

## 2024-08-03 DIAGNOSIS — R7303 Prediabetes: Secondary | ICD-10-CM | POA: Diagnosis present

## 2024-08-03 DIAGNOSIS — D649 Anemia, unspecified: Secondary | ICD-10-CM | POA: Diagnosis present

## 2024-08-03 DIAGNOSIS — E872 Acidosis, unspecified: Secondary | ICD-10-CM | POA: Diagnosis present

## 2024-08-03 DIAGNOSIS — E874 Mixed disorder of acid-base balance: Secondary | ICD-10-CM | POA: Diagnosis present

## 2024-08-03 DIAGNOSIS — R3915 Urgency of urination: Secondary | ICD-10-CM | POA: Diagnosis present

## 2024-08-03 LAB — I-STAT VENOUS BLOOD GAS, ED
Acid-base deficit: 1 mmol/L (ref 0.0–2.0)
Bicarbonate: 19.7 mmol/L — ABNORMAL LOW (ref 20.0–28.0)
Calcium, Ion: 1.19 mmol/L (ref 1.15–1.40)
HCT: 28 % — ABNORMAL LOW (ref 36.0–46.0)
Hemoglobin: 9.5 g/dL — ABNORMAL LOW (ref 12.0–15.0)
O2 Saturation: 86 %
Potassium: 4.4 mmol/L (ref 3.5–5.1)
Sodium: 137 mmol/L (ref 135–145)
TCO2: 20 mmol/L — ABNORMAL LOW (ref 22–32)
pCO2, Ven: 21.5 mmHg — ABNORMAL LOW (ref 44–60)
pH, Ven: 7.57 — ABNORMAL HIGH (ref 7.25–7.43)
pO2, Ven: 42 mmHg (ref 32–45)

## 2024-08-03 LAB — LACTIC ACID, PLASMA
Lactic Acid, Venous: 2.4 mmol/L (ref 0.5–1.9)
Lactic Acid, Venous: 0.9 mmol/L (ref 0.5–1.9)

## 2024-08-03 LAB — WET PREP, GENITAL
Clue Cells Wet Prep HPF POC: NONE SEEN
Sperm: NONE SEEN
Trich, Wet Prep: NONE SEEN
WBC, Wet Prep HPF POC: <10 (ref <10)
Yeast Wet Prep HPF POC: NONE SEEN

## 2024-08-03 LAB — CBC WITH DIFFERENTIAL/PLATELET
Abs Immature Granulocytes: 0.1 K/uL — ABNORMAL HIGH (ref 0.00–0.07)
Basophils Absolute: 0 K/uL (ref 0.0–0.1)
Basophils Relative: 0 %
Eosinophils Absolute: 0 K/uL (ref 0.0–0.5)
Eosinophils Relative: 0 %
HCT: 27.3 % — ABNORMAL LOW (ref 36.0–46.0)
Hemoglobin: 9.9 g/dL — ABNORMAL LOW (ref 12.0–15.0)
Immature Granulocytes: 1 %
Lymphocytes Relative: 8 %
Lymphs Abs: 1.3 K/uL (ref 0.7–4.0)
MCH: 31.3 pg (ref 26.0–34.0)
MCHC: 36.3 g/dL — ABNORMAL HIGH (ref 30.0–36.0)
MCV: 86.4 fL (ref 80.0–100.0)
Monocytes Absolute: 1.3 K/uL — ABNORMAL HIGH (ref 0.1–1.0)
Monocytes Relative: 7 %
Neutro Abs: 14.8 K/uL — ABNORMAL HIGH (ref 1.7–7.7)
Neutrophils Relative %: 84 %
Platelets: 308 K/uL (ref 150–400)
RBC: 3.16 MIL/uL — ABNORMAL LOW (ref 3.87–5.11)
RDW: 11.9 % (ref 11.5–15.5)
WBC: 17.6 K/uL — ABNORMAL HIGH (ref 4.0–10.5)
nRBC: 0 % (ref 0.0–0.2)

## 2024-08-03 LAB — COMPREHENSIVE METABOLIC PANEL WITH GFR
ALT: 16 U/L (ref 0–44)
AST: 23 U/L (ref 15–41)
Albumin: 4.3 g/dL (ref 3.5–5.0)
Alkaline Phosphatase: 47 U/L (ref 38–126)
Anion gap: 17 — ABNORMAL HIGH (ref 5–15)
BUN: 8 mg/dL (ref 6–20)
CO2: 16 mmol/L — ABNORMAL LOW (ref 22–32)
Calcium: 10 mg/dL (ref 8.9–10.3)
Chloride: 104 mmol/L (ref 98–111)
Creatinine, Ser: 1.15 mg/dL — ABNORMAL HIGH (ref 0.44–1.00)
GFR, Estimated: 60 mL/min — ABNORMAL LOW (ref >60)
Glucose, Bld: 123 mg/dL — ABNORMAL HIGH (ref 70–99)
Potassium: 3.4 mmol/L — ABNORMAL LOW (ref 3.5–5.1)
Sodium: 137 mmol/L (ref 135–145)
Total Bilirubin: 0.5 mg/dL (ref 0.0–1.2)
Total Protein: 7.5 g/dL (ref 6.5–8.1)

## 2024-08-03 LAB — HIV ANTIBODY (ROUTINE TESTING W REFLEX): HIV Screen 4th Generation wRfx: NONREACTIVE

## 2024-08-03 MED ORDER — LACTATED RINGERS IV SOLN: INTRAVENOUS | Status: AC

## 2024-08-03 MED ORDER — POTASSIUM CHLORIDE CRYS ER 20 MEQ PO TBCR
20.0000 meq | EXTENDED_RELEASE_TABLET | Freq: Once | ORAL | Status: AC
  Administered 2024-08-03: 20 meq via ORAL
  Filled 2024-08-03: qty 1

## 2024-08-03 MED ORDER — LACTATED RINGERS IV BOLUS
1000.0000 mL | Freq: Once | INTRAVENOUS | Status: AC
  Administered 2024-08-03: 1000 mL via INTRAVENOUS

## 2024-08-03 MED ORDER — ACETAMINOPHEN 325 MG PO TABS
650.0000 mg | ORAL_TABLET | Freq: Four times a day (QID) | ORAL | Status: DC | PRN
  Administered 2024-08-04: 650 mg via ORAL
  Filled 2024-08-03 (×3): qty 2

## 2024-08-03 MED ORDER — ACETAMINOPHEN 500 MG PO TABS
1000.0000 mg | ORAL_TABLET | Freq: Once | ORAL | Status: AC
  Administered 2024-08-03: 1000 mg via ORAL
  Filled 2024-08-03: qty 2

## 2024-08-03 MED ORDER — CEPHALEXIN 500 MG PO CAPS: 500.0000 mg | ORAL_CAPSULE | Freq: Four times a day (QID) | ORAL | 0 refills | Status: DC

## 2024-08-03 MED ORDER — SODIUM CHLORIDE 0.9 % IV SOLN
1.0000 g | Freq: Once | INTRAVENOUS | Status: AC
  Administered 2024-08-03: 1 g via INTRAVENOUS
  Filled 2024-08-03: qty 10

## 2024-08-03 NOTE — Plan of Care (Addendum)
 [facility] emergency department to [facility] transfer medical telemetry bed transfer:  43 year old female no significant past medical history except prediabetic presented to emergency  department again after discharged from the ED for UTI while being diagnosed with SARS positive and diagnosed with UTI sent home with oral Rocephin .  Patient reported she was feeling better however started having high heart rate bilateral tingling of the both hand and burning sensation across the entire body.  Also complaining about pulsatile headache, dysuria, vaginal discharge White thin white-colored that started earlier today. At presentation to ED patient is hemodynamically stable. CBC showing elevated WBC count 17.6, stable H&H and normal platelet count. CMP showing low potassium 3.4, low bicarb 16 elevated creatinine 1.15 and elevated anion gap 17. Elevated lactic acid level 2.4. Wet prep negative results. VBG unremarkable. UA evidence of uti and pending urine culture.  Pending chlamydia/gonorrhea panel.  Chest x-ray unremarkable. CT abdomen pelvis no acute abdominal and pelvic finding.  Evidence of fibroids.  In the ED patient received 2 L of LR bolus and and ceftriaxone  1 g.  Hospitalist has been consulted for further evaluation management of sepsis in the setting of UTI, AKI and metabolic acidosis-respiratory alkalosis.

## 2024-08-03 NOTE — ED Triage Notes (Signed)
 Pt arrived POV, caox4 c/o bodyaches and chills reporting she was d/c this morning, pt tx for sepsis in ED last night. Febrile, tachycardic, tachypneic in triage.

## 2024-08-03 NOTE — ED Provider Notes (Signed)
  Physical Exam  BP 118/87   Pulse 85   Temp 98.1 F (36.7 C) (Oral)   Resp 14   SpO2 100%   Physical Exam  Procedures  Procedures  ED Course / MDM   Clinical Course as of 08/03/24 0818  Tue Aug 02, 2024  2353 Discussed with admitting hospitalist Dr.Howerter who accepts patient for admission [MP]    Clinical Course User Index [MP] Pamella Ozell LABOR, DO   Medical Decision Making Amount and/or Complexity of Data Reviewed Labs: ordered. Radiology: ordered.  Risk Prescription drug management.   Patient has been in the ER now for 13 hours.  Had been admitted admitted but no available beds.  Had been in ops admission for UTI with some sepsis symptoms.  Feeling better now.  Vitals controlled.  Has tolerated orals.  Will see if we can add on a urine culture since that was not initially done.  However will treat with Keflex  for 7 days.  Follow-up with PCP as needed or in about a week.       Patsey Lot, MD 08/03/24 [phone]

## 2024-08-03 NOTE — ED Provider Notes (Signed)
 [place] EMERGENCY DEPARTMENT AT [facility] Provider Note   CSN: [id number] Arrival date & time: 08/03/24  1844     Patient presents with: No chief complaint on file.   Jacqueline Carter is a 43 y.o. female.  HPI Patient is a 43 year old female presenting ED today given after being recently discharged this morning, being SIRS positive and diagnosed with UTI, provided Rocephin  and sent home on Keflex  having felt better at that time.  Presents again today with tachypnea, bilateral tingling to both hands and burning sensation across entire body.  Notes that she has been eating and drinking normally.  Endorses mild frontal headache, pulsatile without light or sound sensitivity.  Also endorsing dysuria and vaginal discharge, thin, white, which is new since being discharged earlier today.  Denies blurry vision, diplopia, vertigo, dysphagia, odynophagia, chest pain, hemoptysis, nausea, vomiting, diarrhea, melena, hematochezia, lower leg swelling.    Prior to Admission medications   Medication Sig Start Date End Date Taking? Authorizing Provider  Biotin 5000 MCG CAPS Take 5,000 mcg by mouth daily.    [provider]  cephALEXin  (KEFLEX ) 500 MG capsule Take 1 capsule (500 mg total) by mouth 4 (four) times daily. 08/03/24   Patsey Lot, MD  cyanocobalamin  500 MCG tablet Take 500 mcg by mouth daily.    [provider]  naproxen  sodium (ALEVE ) 220 MG tablet You can take 1-2 tablets every 12 hours as needed for pain.  You can alternate this with plain Tylenol  or the oxycodone .  You can buy this over-the-counter at any drugstore. 02/15/18   Tonnie George, PA-C  OVER THE COUNTER MEDICATION Magnesium-Take 1 tablet daily.    [provider]    Allergies: Patient has no known allergies.    Review of Systems  Respiratory:  Positive for shortness of breath.   Genitourinary:  Positive for dysuria and vaginal discharge.  Neurological:  Positive for  numbness.  All other systems reviewed and are negative.   Updated Vital Signs BP 118/72   Pulse 87   Temp 99.5 F (37.5 C) (Oral)   Resp 20   Ht 5' 2 (1.575 m)   Wt 68.5 kg   SpO2 100%   BMI 27.62 kg/m   Physical Exam Vitals and nursing note reviewed.  Constitutional:      General: She is not in acute distress.    Appearance: Normal appearance. She is ill-appearing. She is not diaphoretic.  HENT:     Head: Normocephalic and atraumatic.  Eyes:     General: No scleral icterus.       Right eye: No discharge.        Left eye: No discharge.     Extraocular Movements: Extraocular movements intact.     Conjunctiva/sclera: Conjunctivae normal.  Neck:     Meningeal: Brudzinski's sign and Kernig's sign absent.  Cardiovascular:     Rate and Rhythm: Regular rhythm. Tachycardia present.     Pulses: Normal pulses.     Heart sounds: Normal heart sounds. No murmur heard.    No friction rub. No gallop.  Pulmonary:     Effort: Tachypnea present. No accessory muscle usage, respiratory distress or retractions.     Breath sounds: No stridor. No wheezing, rhonchi or rales.  Chest:     Chest wall: No tenderness.  Abdominal:     General: Abdomen is flat. There is no distension.     Palpations: Abdomen is soft.     Tenderness: There is no abdominal tenderness. There is  no right CVA tenderness, left CVA tenderness, guarding or rebound.  Musculoskeletal:        General: No swelling, deformity or signs of injury.     Cervical back: Normal range of motion. Tenderness (Mild paraspinal muscle tenderness noted to cervical spine) present. No rigidity.     Right lower leg: No edema.     Left lower leg: No edema.  Skin:    General: Skin is warm and dry.     Findings: No bruising, erythema or lesion.  Neurological:     General: No focal deficit present.     Mental Status: She is alert and oriented to person, place, and time. Mental status is at baseline.     Sensory: No sensory deficit.      Motor: No weakness.  Psychiatric:        Mood and Affect: Mood normal.     (all labs ordered are listed, but only abnormal results are displayed) Labs Reviewed  CBC WITH DIFFERENTIAL/PLATELET - Abnormal; Notable for the following components:      Result Value   WBC 17.6 (*)    RBC 3.16 (*)    Hemoglobin 9.9 (*)    HCT 27.3 (*)    MCHC 36.3 (*)    Neutro Abs 14.8 (*)    Monocytes Absolute 1.3 (*)    Abs Immature Granulocytes 0.10 (*)    All other components within normal limits  COMPREHENSIVE METABOLIC PANEL WITH GFR - Abnormal; Notable for the following components:   Potassium 3.4 (*)    CO2 16 (*)    Glucose, Bld 123 (*)    Creatinine, Ser 1.15 (*)    GFR, Estimated 60 (*)    Anion gap 17 (*)    All other components within normal limits  LACTIC ACID, PLASMA - Abnormal; Notable for the following components:   Lactic Acid, Venous 2.4 (*)    All other components within normal limits  I-STAT VENOUS BLOOD GAS, ED - Abnormal; Notable for the following components:   pH, Ven 7.570 (*)    pCO2, Ven 21.5 (*)    Bicarbonate 19.7 (*)    TCO2 20 (*)    HCT 28.0 (*)    Hemoglobin 9.5 (*)    All other components within normal limits  WET PREP, GENITAL  CULTURE, BLOOD (ROUTINE X 2)  CULTURE, BLOOD (ROUTINE X 2)  RESP PANEL BY RT-PCR (RSV, FLU A&B, COVID)  RVPGX2  LACTIC ACID, PLASMA  HIV ANTIBODY (ROUTINE TESTING W REFLEX)  PROTIME-INR  GC/CHLAMYDIA PROBE AMP ([place]) NOT AT [facility]    EKG: EKG Interpretation Date/Time:  Wednesday August 03 2024 19:56:56 EDT Ventricular Rate:  91 PR Interval:  152 QRS Duration:  91 QT Interval:  347 QTC Calculation: 427 R Axis:   36  Text Interpretation: Sinus rhythm Abnormal R-wave progression, early transition Confirmed by Randol Simmonds [phone]) on 08/03/2024 8:00:50 PM  Radiology: CT ABDOMEN PELVIS W CONTRAST Result Date: 08/02/2024 EXAM: CT ABDOMEN AND PELVIS WITH CONTRAST 08/02/2024 10:42:14 PM TECHNIQUE: CT of the abdomen and  pelvis was performed with the administration of intravenous contrast. Multiplanar reformatted images are provided for review. Automated exposure control, iterative reconstruction, and/or weight-based adjustment of the mA/kV was utilized to reduce the radiation dose to as low as reasonably achievable. COMPARISON: Right CT 02/14/2018 CLINICAL HISTORY: UTI, recurrent/complicated (Female). Table formatting from the original note was not included.; Pt presents via POV c/o body aches and legs burning since yesterday. Reports some emesis. Reports shaking  and chills. UTI, recurrent/complicated FINDINGS: LOWER CHEST: No acute abnormality. LIVER: The liver is unremarkable. GALLBLADDER AND BILE DUCTS: Gallbladder is unremarkable. No biliary ductal dilatation. SPLEEN: No acute abnormality. PANCREAS: No acute abnormality. ADRENAL GLANDS: No acute abnormality. KIDNEYS, URETERS AND BLADDER: No stones in the kidneys or ureters. No hydronephrosis. No perinephric or periureteral stranding. Urinary bladder is unremarkable. GI AND BOWEL: Stomach demonstrates no acute abnormality. There is no bowel obstruction. PERITONEUM AND RETROPERITONEUM: Trace free fluid in the pelvis is likely physiologic. No free intraperitoneal air. VASCULATURE: Aorta is normal in caliber. LYMPH NODES: No lymphadenopathy. REPRODUCTIVE ORGANS: Heterogeneous enhancement of the uterus suggesting multiple fibroids. BONES AND SOFT TISSUES: No acute osseous abnormality. No focal soft tissue abnormality. IMPRESSION: 1. No acute findings in the abdomen or pelvis. 2. Heterogeneous enhancement of the uterus suggesting multiple fibroids. Electronically signed by: Norman Gatlin MD 08/02/2024 10:55 PM EDT RP Workstation: [id number]   DG Chest Portable 1 View Result Date: 08/02/2024 CLINICAL DATA:  Concern for pneumonia.  Body aches. EXAM: PORTABLE CHEST 1 VIEW COMPARISON:  None Available. FINDINGS: No focal consolidation, pleural effusion, pneumothorax. The cardiac  silhouette is within normal limits. No acute osseous pathology. IMPRESSION: No active disease. Electronically Signed   By: Vanetta Chou M.D.   On: 08/02/2024 21:27    .Critical Care  Performed by: Beola Terrall RAMAN, PA-C Authorized by: Beola Terrall RAMAN, PA-C   Critical care provider statement:    Critical care time (minutes):  43   Critical care time was exclusive of:  Separately billable procedures and treating other patients   Critical care was necessary to treat or prevent imminent or life-threatening deterioration of the following conditions:  Sepsis   Critical care was time spent personally by me on the following activities:  Development of treatment plan with patient or surrogate, discussions with consultants, evaluation of patient's response to treatment, examination of patient, ordering and review of laboratory studies, ordering and review of radiographic studies, ordering and performing treatments and interventions, pulse oximetry, re-evaluation of patient's condition, review of old charts and obtaining history from patient or surrogate   I assumed direction of critical care for this patient from another provider in my specialty: yes     Care discussed with: admitting provider      Medications Ordered in the ED  lactated ringers  infusion (has no administration in time range)  acetaminophen  (TYLENOL ) tablet 650 mg (has no administration in time range)  lactated ringers  bolus 1,000 mL (0 mLs Intravenous Stopped 08/03/24 2235)  lactated ringers  bolus 1,000 mL (0 mLs Intravenous Stopped 08/03/24 2236)  cefTRIAXone  (ROCEPHIN ) 1 g in sodium chloride  0.9 % 100 mL IVPB (1 g Intravenous New Bag/Given 08/03/24 2238)  acetaminophen  (TYLENOL ) tablet 1,000 mg (1,000 mg Oral Given 08/03/24 2236)    Medical Decision Making Amount and/or Complexity of Data Reviewed Labs: ordered. ECG/medicine tests: ordered.  Risk OTC drugs. Prescription drug management. Decision regarding  hospitalization.   This patient is a 43 year old female  who presents to the ED for concern of recurrent symptoms, recently admitted for observation and provided Rocephin  for UTI being SIRS positive and discharged early this morning coming in today again after having taken 2 doses of Keflex  outpatient with recurrent total burning sensation across body as well as bilateral finger tingling, aches.  On physical exam, patient is in no acute distress, afebrile, alert and orient x 4, speaking in full sentences.  Notably tachypneic, seeming to be self inducing hyperventilation as when instructed to slow breathing with  normal saturations, her tingling sensations abated.  LCTAB.  Also noted to be mildly tachycardic with heart rate of 110s.  No murmur noted.  No abdominal tenderness noted to palpation.  No CVA tenderness.  No meningeal signs.  With current symptoms, will repeat labs From earlier today to provide fluids.  Noted to be in metabolic acidosis and respiratory alkalosis.  With elevated lactic acidosis.  Repeated blood cultures, and provided 2 L of fluid with continuous infusion.  After blood cultures, started on a gram Rocephin .  With patient's current presentation, we will seek to try to admit for failing outpatient treatment.  Patient care was then transferred over to Dr. Karmen.    Differential diagnoses prior to evaluation: The emergent differential diagnosis includes, but is not limited to, meningitis, encephalitis , Sepsis, UTI, cystitis, STI, BV, PID, tension headache, migraine, polypharmacy, substance abuse, sinusitis, cervicogenic headache, dehydration, cluster headache, trigeminal neuralgia, IIH, PRES syndrome, intracranial bleed, CVA. This is not an exhaustive differential.   Past Medical History / Co-morbidities / Social History: Diabetes, status post appendectomy  Additional history: Chart reviewed. Pertinent results include:    Notably seen in the ER this morning, was in  the ER for 13 hours before being discharged on Keflex  with follow-up with PCP.  Tolerating orals and had urine culture sent.  Had been here for hobs admission for UTI with sepsis symptoms.  Feeling better at the time of discharge.  Already provided Rocephin .  CT abdomen pelvis did not show any signs of pyelonephritis or any other acute abnormality.  Did meet SIRS criteria that time.  Lab Tests/Imaging studies: I personally interpreted labs/imaging and the pertinent results include:   CBC notes a leukocytosis of 17.6 as well as a anemia with hemoglobin 9.9 CMP notes elevated creatinine of 1.15 and anion gap of 17 Lactic acid noted be 2.4 with second lactic pending, HIV pending, i-STAT VBG shows respiratory alkalosis with decreased bicarb.   Cardiac monitoring: EKG obtained and interpreted by myself and attending physician which shows: Sinus rhythm  EKG Interpretation Date/Time:  Wednesday August 03 2024 19:56:56 EDT Ventricular Rate:  91 PR Interval:  152 QRS Duration:  91 QT Interval:  347 QTC Calculation: 427 R Axis:   36  Text Interpretation: Sinus rhythm Abnormal R-wave progression, early transition Confirmed by Randol Simmonds [phone]) on 08/03/2024 8:00:50 PM          Medications: I ordered medication including Rocephin , LR, Tylenol .  I have reviewed the patients home medicines and have made adjustments as needed.  Critical Interventions: None  Social Determinants of Health:  Disposition: After consideration of the diagnostic results and the patients response to treatment, I feel that the patient would benefit from admission, with treatment inpatient, patient care transferred over to Dr. Karmen.   Final diagnoses:  Sepsis without acute organ dysfunction, due to unspecified organism [facility])  Urinary tract infection with hematuria, site unspecified    ED Discharge Orders     None          Beola Terrall GORMAN DEVONNA 08/03/24 2311    Randol Simmonds, MD 08/05/24 [phone]

## 2024-08-04 DIAGNOSIS — A419 Sepsis, unspecified organism: Secondary | ICD-10-CM

## 2024-08-04 DIAGNOSIS — N390 Urinary tract infection, site not specified: Secondary | ICD-10-CM

## 2024-08-04 LAB — GC/CHLAMYDIA PROBE AMP (~~LOC~~) NOT AT ARMC
Chlamydia: NEGATIVE
Comment: NEGATIVE
Comment: NORMAL
Neisseria Gonorrhea: NEGATIVE

## 2024-08-04 LAB — PROTIME-INR
INR: 1.1 (ref 0.8–1.2)
Prothrombin Time: 15.2 s (ref 11.4–15.2)

## 2024-08-04 MED ORDER — ACETAMINOPHEN 325 MG PO TABS: 650.0000 mg | ORAL_TABLET | Freq: Four times a day (QID) | ORAL | Status: DC | PRN

## 2024-08-04 MED ORDER — SODIUM CHLORIDE 0.9 % IV SOLN
1.0000 g | INTRAVENOUS | Status: DC
  Administered 2024-08-04: 1 g via INTRAVENOUS
  Filled 2024-08-04: qty 10

## 2024-08-04 MED ORDER — IBUPROFEN 400 MG PO TABS
400.0000 mg | ORAL_TABLET | Freq: Once | ORAL | Status: AC
  Administered 2024-08-04: 400 mg via ORAL
  Filled 2024-08-04: qty 1

## 2024-08-04 MED ORDER — PROCHLORPERAZINE EDISYLATE 10 MG/2ML IJ SOLN
10.0000 mg | Freq: Once | INTRAMUSCULAR | Status: AC
  Administered 2024-08-04: 10 mg via INTRAVENOUS
  Filled 2024-08-04: qty 2

## 2024-08-04 MED ORDER — BUTALBITAL-APAP-CAFFEINE 50-325-40 MG PO TABS
1.0000 | ORAL_TABLET | Freq: Four times a day (QID) | ORAL | Status: DC | PRN
  Administered 2024-08-04: 1 via ORAL
  Filled 2024-08-04: qty 1

## 2024-08-04 MED ORDER — ONDANSETRON HCL 4 MG/2ML IJ SOLN: 4.0000 mg | Freq: Four times a day (QID) | INTRAMUSCULAR | Status: DC | PRN

## 2024-08-04 MED ORDER — ALBUTEROL SULFATE (2.5 MG/3ML) 0.083% IN NEBU: 2.5000 mg | INHALATION_SOLUTION | RESPIRATORY_TRACT | Status: DC | PRN

## 2024-08-04 MED ORDER — ENOXAPARIN SODIUM 40 MG/0.4ML IJ SOSY
40.0000 mg | PREFILLED_SYRINGE | INTRAMUSCULAR | Status: DC
  Administered 2024-08-04 – 2024-08-05 (×2): 40 mg via SUBCUTANEOUS
  Filled 2024-08-04 (×2): qty 0.4

## 2024-08-04 MED ORDER — TRAZODONE HCL 50 MG PO TABS: 25.0000 mg | ORAL_TABLET | Freq: Every evening | ORAL | Status: DC | PRN

## 2024-08-04 MED ORDER — ONDANSETRON HCL 4 MG PO TABS: 4.0000 mg | ORAL_TABLET | Freq: Four times a day (QID) | ORAL | Status: DC | PRN

## 2024-08-04 NOTE — H&P (Signed)
 History and Physical  Jacqueline Carter FMW:[id number] DOB: 01-26-1981 DOA: 08/03/2024  PCP: Benjamine Aland, MD   Chief Complaint: Fevers, chills  HPI: Jacqueline Carter is a 43 y.o. female with medical history significant for prediabetes not on prescription medications, who was diagnosed with sepsis due to UTI on the evening of 9/9, eventually sent home and now returns for admission due to continued sepsis related to UTI.  She was diagnosed in the emergency department initially on 9/9, was accepted for hospitalist admission due to sepsis due to UTI.  She waited for a bed through the night, the following morning was feeling much better with sepsis physiology resolved and was therefore discharged home in stable condition with oral antibiotics on 9/10.  Later that evening, she continued to feel unwell, felt tachypnea, fevers, chills, bilateral tingling to her hands and legs.  She returned to the ER for evaluation, where she was found to have tachycardia, persistent leukocytosis, elevated lactic acid 2.4.  She was given IV fluids, empiric IV Rocephin , and admitted to the hospitalist service.  Review of Systems: Please see HPI for pertinent positives and negatives. A complete 10 system review of systems are otherwise negative.  Past Medical History:  Diagnosis Date   Complication of anesthesia    required re-dosing of epidural with 2008 delivery   Diabetes mellitus    Gestational diabetes    PONV (postoperative nausea and vomiting)    Past Surgical History:  Procedure Laterality Date   CESAREAN SECTION     CESAREAN SECTION  09/25/2012   Procedure: CESAREAN SECTION;  Surgeon: Marjorie DEL. Okey, MD;  Location: [facility];  Service: Obstetrics;  Laterality: N/A;  Repeat cesarean section of baby boy  at 0102  APGAR 9/9   LAPAROSCOPIC APPENDECTOMY N/A 02/14/2018   Procedure: APPENDECTOMY LAPAROSCOPIC;  Surgeon: Vanderbilt Ned, MD;  Location: [facility];  Service: General;  Laterality: N/A;   Social  History:  reports that she has never smoked. She has never used smokeless tobacco. She reports that she does not drink alcohol and does not use drugs.  No Known Allergies  Family History  Problem Relation Age of Onset   Other Neg Hx      Prior to Admission medications   Medication Sig Start Date End Date Taking? Authorizing Provider  acetaminophen  (TYLENOL ) 500 MG tablet Take 500 mg by mouth as needed for headache.   Yes [provider]  Biotin 5000 MCG CAPS Take 5,000 mcg by mouth daily.   Yes [provider]  cephALEXin  (KEFLEX ) 500 MG capsule Take 1 capsule (500 mg total) by mouth 4 (four) times daily. 08/03/24  Yes Patsey Lot, MD  cyanocobalamin  500 MCG tablet Take 500 mcg by mouth daily.   Yes [provider]  ibuprofen  (ADVIL ) 200 MG tablet Take 200 mg by mouth as needed for headache.   Yes [provider]  OVER THE COUNTER MEDICATION Take 3 tablets by mouth daily. **meno-chew for perimenopause** Chews   Yes [provider]    Physical Exam: BP 125/87 (BP Location: Right Arm)   Pulse 83   Temp 98.5 F (36.9 C) (Oral)   Resp 16   Ht 5' 2 (1.575 m)   Wt 68.5 kg   SpO2 100%   BMI 27.62 kg/m  General:  Alert, oriented, calm, in no acute distress, looks quite comfortable and nontoxic Cardiovascular: RRR, no murmurs or rubs, no peripheral edema  Respiratory: clear to auscultation bilaterally, no wheezes, no crackles  Abdomen: soft, nontender, nondistended,  normal bowel tones heard  Skin: dry, no rashes  Musculoskeletal: no joint effusions, normal range of motion  Psychiatric: appropriate affect, normal speech  Neurologic: extraocular muscles intact, clear speech, moving all extremities with intact sensorium         Labs on Admission:  Basic Metabolic Panel: Recent Labs  Lab 08/02/24 2002 08/03/24 2008 08/03/24 2012  NA 136 137 137  K 4.2 4.4 3.4*  CL 102  --  104  CO2 15*  --  16*  GLUCOSE 143*  --  123*  BUN 9   --  8  CREATININE 1.22*  --  1.15*  CALCIUM 10.7*  --  10.0   Liver Function Tests: Recent Labs  Lab 08/03/24 2012  AST 23  ALT 16  ALKPHOS 47  BILITOT 0.5  PROT 7.5  ALBUMIN 4.3   No results for input(s): LIPASE, AMYLASE in the last 168 hours. No results for input(s): AMMONIA in the last 168 hours. CBC: Recent Labs  Lab 08/02/24 2002 08/03/24 2008 08/03/24 2012  WBC 19.6*  --  17.6*  NEUTROABS 17.2*  --  14.8*  HGB 11.4* 9.5* 9.9*  HCT 33.0* 28.0* 27.3*  MCV 89.2  --  86.4  PLT 228  --  308   Cardiac Enzymes: No results for input(s): CKTOTAL, CKMB, CKMBINDEX, TROPONINI in the last 168 hours. BNP (last 3 results) No results for input(s): BNP in the last 8760 hours.  ProBNP (last 3 results) No results for input(s): PROBNP in the last 8760 hours.  CBG: No results for input(s): GLUCAP in the last 168 hours.  Radiological Exams on Admission: CT ABDOMEN PELVIS W CONTRAST Result Date: 08/02/2024 EXAM: CT ABDOMEN AND PELVIS WITH CONTRAST 08/02/2024 10:42:14 PM TECHNIQUE: CT of the abdomen and pelvis was performed with the administration of intravenous contrast. Multiplanar reformatted images are provided for review. Automated exposure control, iterative reconstruction, and/or weight-based adjustment of the mA/kV was utilized to reduce the radiation dose to as low as reasonably achievable. COMPARISON: Right CT 02/14/2018 CLINICAL HISTORY: UTI, recurrent/complicated (Female). Table formatting from the original note was not included.; Pt presents via POV c/o body aches and legs burning since yesterday. Reports some emesis. Reports shaking and chills. UTI, recurrent/complicated FINDINGS: LOWER CHEST: No acute abnormality. LIVER: The liver is unremarkable. GALLBLADDER AND BILE DUCTS: Gallbladder is unremarkable. No biliary ductal dilatation. SPLEEN: No acute abnormality. PANCREAS: No acute abnormality. ADRENAL GLANDS: No acute abnormality. KIDNEYS, URETERS AND  BLADDER: No stones in the kidneys or ureters. No hydronephrosis. No perinephric or periureteral stranding. Urinary bladder is unremarkable. GI AND BOWEL: Stomach demonstrates no acute abnormality. There is no bowel obstruction. PERITONEUM AND RETROPERITONEUM: Trace free fluid in the pelvis is likely physiologic. No free intraperitoneal air. VASCULATURE: Aorta is normal in caliber. LYMPH NODES: No lymphadenopathy. REPRODUCTIVE ORGANS: Heterogeneous enhancement of the uterus suggesting multiple fibroids. BONES AND SOFT TISSUES: No acute osseous abnormality. No focal soft tissue abnormality. IMPRESSION: 1. No acute findings in the abdomen or pelvis. 2. Heterogeneous enhancement of the uterus suggesting multiple fibroids. Electronically signed by: Norman Gatlin MD 08/02/2024 10:55 PM EDT RP Workstation: [id number]   DG Chest Portable 1 View Result Date: 08/02/2024 CLINICAL DATA:  Concern for pneumonia.  Body aches. EXAM: PORTABLE CHEST 1 VIEW COMPARISON:  None Available. FINDINGS: No focal consolidation, pleural effusion, pneumothorax. The cardiac silhouette is within normal limits. No acute osseous pathology. IMPRESSION: No active disease. Electronically Signed   By: Vanetta Chou M.D.   On: 08/02/2024 21:27  Assessment/Plan Jacqueline Carter is a 43 y.o. female with medical history significant for prediabetes not on prescription medications, who was diagnosed with sepsis due to UTI on the evening of 9/9, eventually sent home and now returns for admission due to continued sepsis related to UTI.  Severe sepsis due to UTI-meeting criteria with tachycardia, leukocytosis, source is UTI, endorgan dysfunction with initial lactate 2.4.  Currently patient is hemodynamically stable with resolving sepsis physiology, and lactic acid has normalized. -Inpatient admission -Continue empiric IV Rocephin  -Follow-up blood and urine culture, culture from 9/9 growing E. coli with sensitivities pending  Chronic  anemia-without history of bleeding -Outpatient follow-up  DVT prophylaxis: Lovenox      Code Status: Full Code  Consults called: None  Admission status: The appropriate patient status for this patient is INPATIENT. Inpatient status is judged to be reasonable and necessary in order to provide the required intensity of service to ensure the patient's safety. The patient's presenting symptoms, physical exam findings, and initial radiographic and laboratory data in the context of their chronic comorbidities is felt to place them at high risk for further clinical deterioration. Furthermore, it is not anticipated that the patient will be medically stable for discharge from the hospital within 2 midnights of admission.    I certify that at the point of admission it is my clinical judgment that the patient will require inpatient hospital care spanning beyond 2 midnights from the point of admission due to high intensity of service, high risk for further deterioration and high frequency of surveillance required  Time spent: 56 minutes  Suzzette Gasparro CHRISTELLA Gail MD [facility] Pager [phone]  If 7PM-7AM, please contact night-coverage [email or web address] Password [facility]  08/04/2024, 1:59 PM

## 2024-08-04 NOTE — ED Notes (Signed)
 Family at bedside.

## 2024-08-04 NOTE — ED Notes (Signed)
 ED TO INPATIENT HANDOFF REPORT  ED Paramedic Name and Phone # Ester Sloop EMT-P: [phone] S Name/Age/Gender Jacqueline Carter 43 y.o. female Room/Bed: DB012/DB012  Code Status   Code Status: Prior  Home/SNF/Other Home Patient oriented to: self, place, time, and situation Is this baseline? Yes   Triage Complete: Triage complete  Chief Complaint Sepsis secondary to UTI (HCC) [A41.9, N39.0]  Triage Note Pt arrived POV, caox4 c/o bodyaches and chills reporting she was d/c this morning, pt tx for sepsis in ED last night. Febrile, tachycardic, tachypneic in triage.    Allergies No Known Allergies  Level of Care/Admitting Diagnosis ED Disposition     ED Disposition  Admit   Condition  --   Comment  Hospital Area: [facility] [[id number]]  Level of Care: Telemetry Medical [104]  May admit patient to [facility] or [facility] if equivalent level of care is available:: Yes  Interfacility transfer: Yes  Covid Evaluation: Asymptomatic - no recent exposure (last 10 days) testing not required  Diagnosis: Sepsis secondary to UTI [facility]) [300253]  Admitting Physician: SUNDIL, SUBRINA [[id number]]  Attending Physician: SUNDIL, SUBRINA [[id number]]  Certification:: I certify this patient will need inpatient services for at least 2 midnights  Expected Medical Readiness: 08/06/2024          B Medical/Surgery History Past Medical History:  Diagnosis Date   Complication of anesthesia    required re-dosing of epidural with 2008 delivery   Diabetes mellitus    Gestational diabetes    PONV (postoperative nausea and vomiting)    Past Surgical History:  Procedure Laterality Date   CESAREAN SECTION     CESAREAN SECTION  09/25/2012   Procedure: CESAREAN SECTION;  Surgeon: Marjorie DEL. Okey, MD;  Location: [facility];  Service: Obstetrics;  Laterality: N/A;  Repeat cesarean section of baby boy  at 0102  APGAR 9/9   LAPAROSCOPIC APPENDECTOMY N/A 02/14/2018   Procedure:  APPENDECTOMY LAPAROSCOPIC;  Surgeon: Vanderbilt Ned, MD;  Location: [facility];  Service: General;  Laterality: N/A;     A IV Location/Drains/Wounds Patient Lines/Drains/Airways Status     Active Line/Drains/Airways     Name Placement date Placement time Site Days   Peripheral IV 08/03/24 20 G Anterior;Right Forearm 08/03/24  1928  Forearm  1   Incision - 3 Ports Abdomen Umbilicus Upper;Medial Lower;Medial 02/14/18  1321  -- 2363            Intake/Output Last 24 hours  Intake/Output Summary (Last 24 hours) at 08/04/2024 1005 Last data filed at 08/04/2024 0709 Gross per 24 hour  Intake 3172.53 ml  Output --  Net 3172.53 ml    Labs/Imaging Results for orders placed or performed during the hospital encounter of 08/03/24 (from the past 48 hours)  I-Stat venous blood gas, ([facility], [facility], [facility])     Status: Abnormal   Collection Time: 08/03/24  8:08 PM  Result Value Ref Range   pH, Ven 7.570 (H) 7.25 - 7.43   pCO2, Ven 21.5 (L) 44 - 60 mmHg   pO2, Ven 42 32 - 45 mmHg   Bicarbonate 19.7 (L) 20.0 - 28.0 mmol/L   TCO2 20 (L) 22 - 32 mmol/L   O2 Saturation 86 %   Acid-base deficit 1.0 0.0 - 2.0 mmol/L   Sodium 137 135 - 145 mmol/L   Potassium 4.4 3.5 - 5.1 mmol/L   Calcium, Ion 1.19 1.15 - 1.40 mmol/L   HCT 28.0 (L) 36.0 - 46.0 %   Hemoglobin 9.5 (  L) 12.0 - 15.0 g/dL   Collection site IV start    Drawn by Nurse    Sample type VENOUS   CBC with Differential     Status: Abnormal   Collection Time: 08/03/24  8:12 PM  Result Value Ref Range   WBC 17.6 (H) 4.0 - 10.5 K/uL   RBC 3.16 (L) 3.87 - 5.11 MIL/uL   Hemoglobin 9.9 (L) 12.0 - 15.0 g/dL   HCT 72.6 (L) 63.9 - 53.9 %   MCV 86.4 80.0 - 100.0 fL   MCH 31.3 26.0 - 34.0 pg   MCHC 36.3 (H) 30.0 - 36.0 g/dL   RDW 88.0 88.4 - 84.4 %   Platelets 308 150 - 400 K/uL   nRBC 0.0 0.0 - 0.2 %   Neutrophils Relative % 84 %   Neutro Abs 14.8 (H) 1.7 - 7.7 K/uL   Lymphocytes Relative 8 %   Lymphs Abs 1.3 0.7 - 4.0 K/uL   Monocytes Relative  7 %   Monocytes Absolute 1.3 (H) 0.1 - 1.0 K/uL   Eosinophils Relative 0 %   Eosinophils Absolute 0.0 0.0 - 0.5 K/uL   Basophils Relative 0 %   Basophils Absolute 0.0 0.0 - 0.1 K/uL   Immature Granulocytes 1 %   Abs Immature Granulocytes 0.10 (H) 0.00 - 0.07 K/uL    Comment: Performed at [facility], [address], [place], [place] [address]  Comprehensive metabolic panel     Status: Abnormal   Collection Time: 08/03/24  8:12 PM  Result Value Ref Range   Sodium 137 135 - 145 mmol/L   Potassium 3.4 (L) 3.5 - 5.1 mmol/L   Chloride 104 98 - 111 mmol/L   CO2 16 (L) 22 - 32 mmol/L   Glucose, Bld 123 (H) 70 - 99 mg/dL    Comment: Glucose reference range applies only to samples taken after fasting for at least 8 hours.   BUN 8 6 - 20 mg/dL   Creatinine, Ser 8.84 (H) 0.44 - 1.00 mg/dL   Calcium 89.9 8.9 - 89.6 mg/dL   Total Protein 7.5 6.5 - 8.1 g/dL   Albumin 4.3 3.5 - 5.0 g/dL   AST 23 15 - 41 U/L    Comment: HEMOLYSIS AT THIS LEVEL MAY AFFECT RESULT   ALT 16 0 - 44 U/L   Alkaline Phosphatase 47 38 - 126 U/L   Total Bilirubin 0.5 0.0 - 1.2 mg/dL   GFR, Estimated 60 (L) >60 mL/min    Comment: (NOTE) Calculated using the CKD-EPI Creatinine Equation (2021)    Anion gap 17 (H) 5 - 15    Comment: Performed at [facility], [address] [place], [place] [address]  Lactic acid, plasma     Status: Abnormal   Collection Time: 08/03/24  8:12 PM  Result Value Ref Range   Lactic Acid, Venous 2.4 (HH) 0.5 - 1.9 mmol/L    Comment: Critical Value, Read Back and verified with Katrinka Sjogren RN,08/03/2024 @ 2055 by ADELBERT Performed at [facility], [address] [place], [place] [address]   Wet prep, genital     Status: None   Collection Time: 08/03/24  8:12 PM   Specimen: Vaginal  Result Value Ref Range   Yeast Wet Prep HPF POC NONE SEEN NONE SEEN   Trich, Wet Prep NONE SEEN NONE SEEN   Clue Cells Wet Prep HPF POC NONE SEEN NONE  SEEN   WBC, Wet Prep HPF POC <10 <10  Sperm NONE SEEN     Comment: Performed at [facility], [address], [place], [place] [address]  HIV Antibody (routine testing w rflx)     Status: None   Collection Time: 08/03/24  8:12 PM  Result Value Ref Range   HIV Screen 4th Generation wRfx Non Reactive Non Reactive    Comment: Performed at [facility], [address], [place], [place] [address]  Lactic acid, plasma     Status: None   Collection Time: 08/03/24 11:17 PM  Result Value Ref Range   Lactic Acid, Venous 0.9 0.5 - 1.9 mmol/L    Comment: Performed at [facility], [address] [place], [place] [address]  Protime-INR     Status: None   Collection Time: 08/03/24 11:32 PM  Result Value Ref Range   Prothrombin Time 15.2 11.4 - 15.2 seconds   INR 1.1 0.8 - 1.2    Comment: (NOTE) INR goal varies based on device and disease states. Performed at [facility], [address] [place], [place] [address]    CT ABDOMEN PELVIS W CONTRAST Result Date: 08/02/2024 EXAM: CT ABDOMEN AND PELVIS WITH CONTRAST 08/02/2024 10:42:14 PM TECHNIQUE: CT of the abdomen and pelvis was performed with the administration of intravenous contrast. Multiplanar reformatted images are provided for review. Automated exposure control, iterative reconstruction, and/or weight-based adjustment of the mA/kV was utilized to reduce the radiation dose to as low as reasonably achievable. COMPARISON: Right CT 02/14/2018 CLINICAL HISTORY: UTI, recurrent/complicated (Female). Table formatting from the original note was not included.; Pt presents via POV c/o body aches and legs burning since yesterday. Reports some emesis. Reports shaking and chills. UTI, recurrent/complicated FINDINGS: LOWER CHEST: No acute abnormality. LIVER: The liver is unremarkable. GALLBLADDER AND BILE DUCTS: Gallbladder is unremarkable. No biliary ductal dilatation. SPLEEN: No acute  abnormality. PANCREAS: No acute abnormality. ADRENAL GLANDS: No acute abnormality. KIDNEYS, URETERS AND BLADDER: No stones in the kidneys or ureters. No hydronephrosis. No perinephric or periureteral stranding. Urinary bladder is unremarkable. GI AND BOWEL: Stomach demonstrates no acute abnormality. There is no bowel obstruction. PERITONEUM AND RETROPERITONEUM: Trace free fluid in the pelvis is likely physiologic. No free intraperitoneal air. VASCULATURE: Aorta is normal in caliber. LYMPH NODES: No lymphadenopathy. REPRODUCTIVE ORGANS: Heterogeneous enhancement of the uterus suggesting multiple fibroids. BONES AND SOFT TISSUES: No acute osseous abnormality. No focal soft tissue abnormality. IMPRESSION: 1. No acute findings in the abdomen or pelvis. 2. Heterogeneous enhancement of the uterus suggesting multiple fibroids. Electronically signed by: Norman Gatlin MD 08/02/2024 10:55 PM EDT RP Workstation: [id number]   DG Chest Portable 1 View Result Date: 08/02/2024 CLINICAL DATA:  Concern for pneumonia.  Body aches. EXAM: PORTABLE CHEST 1 VIEW COMPARISON:  None Available. FINDINGS: No focal consolidation, pleural effusion, pneumothorax. The cardiac silhouette is within normal limits. No acute osseous pathology. IMPRESSION: No active disease. Electronically Signed   By: Vanetta Chou M.D.   On: 08/02/2024 21:27    Pending Labs Unresulted Labs (From admission, onward)     Start     Ordered   08/03/24 2201  Blood culture (routine x 2)  BLOOD CULTURE X 2,   STAT      08/03/24 2200            Vitals/Pain Today's Vitals   08/04/24 0845 08/04/24 0900 08/04/24 0914 08/04/24 1000  BP: 127/78 (!) 122/92  117/87  Pulse: 73 71  98  Resp: (!) 32 (!) 29  20  Temp: 98.2 F (  36.8 C)     TempSrc: Oral     SpO2: 100% 100%  99%  Weight:      Height:      PainSc:   6      Isolation Precautions No active isolations  Medications Medications  lactated ringers  infusion ( Intravenous Infusion Verify  08/04/24 0709)  acetaminophen  (TYLENOL ) tablet 650 mg (has no administration in time range)  lactated ringers  bolus 1,000 mL (0 mLs Intravenous Stopped 08/03/24 2235)  lactated ringers  bolus 1,000 mL (0 mLs Intravenous Stopped 08/03/24 2236)  cefTRIAXone  (ROCEPHIN ) 1 g in sodium chloride  0.9 % 100 mL IVPB (0 g Intravenous Stopped 08/03/24 2332)  acetaminophen  (TYLENOL ) tablet 1,000 mg (1,000 mg Oral Given 08/03/24 2236)  potassium chloride  SA (KLOR-CON  M) CR tablet 20 mEq (20 mEq Oral Given 08/03/24 2342)  ibuprofen  (ADVIL ) tablet 400 mg (400 mg Oral Given 08/04/24 0512)  prochlorperazine  (COMPAZINE ) injection 10 mg (10 mg Intravenous Given 08/04/24 0921)    Mobility walks     Focused Assessments Cardiac Assessment Handoff:  Cardiac Rhythm: Normal sinus rhythm No results found for: CKTOTAL, CKMB, CKMBINDEX, TROPONINI No results found for: DDIMER Does the Patient currently have chest pain? No    R Recommendations: See Admitting Provider Note

## 2024-08-04 NOTE — ED Notes (Signed)
 Infinity with cl called for transport

## 2024-08-05 DIAGNOSIS — E876 Hypokalemia: Secondary | ICD-10-CM

## 2024-08-05 DIAGNOSIS — D649 Anemia, unspecified: Secondary | ICD-10-CM

## 2024-08-05 DIAGNOSIS — N3000 Acute cystitis without hematuria: Secondary | ICD-10-CM

## 2024-08-05 LAB — CBC
HCT: 27.2 % — ABNORMAL LOW (ref 36.0–46.0)
Hemoglobin: 9.3 g/dL — ABNORMAL LOW (ref 12.0–15.0)
MCH: 30.9 pg (ref 26.0–34.0)
MCHC: 34.2 g/dL (ref 30.0–36.0)
MCV: 90.4 fL (ref 80.0–100.0)
Platelets: 310 K/uL (ref 150–400)
RBC: 3.01 MIL/uL — ABNORMAL LOW (ref 3.87–5.11)
RDW: 11.9 % (ref 11.5–15.5)
WBC: 10.9 K/uL — ABNORMAL HIGH (ref 4.0–10.5)
nRBC: 0 % (ref 0.0–0.2)

## 2024-08-05 LAB — URINE CULTURE: Culture: >=100000 — AB

## 2024-08-05 LAB — BASIC METABOLIC PANEL WITH GFR
Anion gap: 13 (ref 5–15)
BUN: <5 mg/dL — ABNORMAL LOW (ref 6–20)
CO2: 21 mmol/L — ABNORMAL LOW (ref 22–32)
Calcium: 9.4 mg/dL (ref 8.9–10.3)
Chloride: 105 mmol/L (ref 98–111)
Creatinine, Ser: 0.82 mg/dL (ref 0.44–1.00)
GFR, Estimated: >60 mL/min (ref >60)
Glucose, Bld: 103 mg/dL — ABNORMAL HIGH (ref 70–99)
Potassium: 3.5 mmol/L (ref 3.5–5.1)
Sodium: 139 mmol/L (ref 135–145)

## 2024-08-05 LAB — MAGNESIUM: Magnesium: 2.1 mg/dL (ref 1.7–2.4)

## 2024-08-05 MED ORDER — VITAMIN B-12 100 MCG PO TABS
500.0000 ug | ORAL_TABLET | Freq: Every day | ORAL | Status: DC
  Administered 2024-08-05 – 2024-08-06 (×2): 500 ug via ORAL
  Filled 2024-08-05 (×2): qty 5

## 2024-08-05 MED ORDER — SODIUM CHLORIDE 0.9 % IV SOLN
2.0000 g | INTRAVENOUS | Status: DC
  Administered 2024-08-05: 2 g via INTRAVENOUS
  Filled 2024-08-05: qty 20

## 2024-08-05 MED ORDER — POTASSIUM CHLORIDE CRYS ER 20 MEQ PO TBCR
40.0000 meq | EXTENDED_RELEASE_TABLET | Freq: Once | ORAL | Status: AC
  Administered 2024-08-05: 40 meq via ORAL
  Filled 2024-08-05: qty 2

## 2024-08-05 NOTE — Progress Notes (Signed)
 PROGRESS NOTE    Jacqueline Carter  FMW:[id number] DOB: 10/10/1981 DOA: 08/03/2024 PCP: Benjamine Aland, MD    No chief complaint on file.   Brief Narrative: Patient pleasant 43 year old female history of prediabetes, not on prescription medications recently diagnosed with sepsis secondary to UTI in the evening of 9 9, eventually sent home presenting back with continued sepsis related to UTI.   Assessment & Plan:   Principal Problem:   Sepsis secondary to UTI [facility]) Active Problems:   UTI (urinary tract infection)   Hypokalemia   Chronic anemia  #1 severe sepsis secondary to UTI -Patient criteria for sepsis with tachycardia, leukocytosis, UTI, endorgan dysfunction with initial lactate of 2.4. - Patient hemodynamically open clinically with resolving sepsis physiology and normalization of lactic acid level. - Blood cultures ordered and pending. -Patient afebrile. -Leukocytosis trending down. -Improving clinically. - Preliminary urine cultures from 08/02/2024 with E. coli with sensitivities pending. - Continue IV Rocephin  pending finalization of urine cultures.  2.  Chronic anemia without history of bleeding -CT abdomen and pelvis done with fibroids noted. - Hemoglobin currently stable at 9.3. - Follow H&H. - Transfusion threshold hemoglobin < 7.  3.  Hypokalemia -Repleted.   DVT prophylaxis: Lovenox  Code Status: Full Family Communication: Updated patient and husband at bedside. Disposition: Home when clinically improved.  Status is: Inpatient Remains inpatient appropriate because: Severity of illness   Consultants:  None  Procedures:  CT abdomen and pelvis 08/02/2024 Chest x-ray 08/02/2024  Antimicrobials:  Anti-infectives (From admission, onward)    Start     Dose/Rate Route Frequency Ordered Stop   08/05/24 2100  cefTRIAXone  (ROCEPHIN ) 2 g in sodium chloride  0.9 % 100 mL IVPB        2 g 200 mL/hr over 30 Minutes Intravenous Every 24 hours 08/05/24 0903      08/04/24 2200  cefTRIAXone  (ROCEPHIN ) 1 g in sodium chloride  0.9 % 100 mL IVPB  Status:  Discontinued        1 g 200 mL/hr over 30 Minutes Intravenous Every 24 hours 08/04/24 1336 08/05/24 0903   08/03/24 2200  cefTRIAXone  (ROCEPHIN ) 1 g in sodium chloride  0.9 % 100 mL IVPB        1 g 200 mL/hr over 30 Minutes Intravenous  Once 08/03/24 2159 08/03/24 2332         Subjective: Sitting up by windowsill with husband eating lunch.  States dysuria and urinary frequency and urgency have improved.  Denies any further suprapubic abdominal pain.  No nausea or vomiting.  Tolerating oral intake.  Objective: Vitals:   08/04/24 1952 08/05/24 0012 08/05/24 0604 08/05/24 1317  BP: 124/80 129/86 115/85 124/84  Pulse: 79 98 79 73  Resp: 18 20 20 16   Temp: 99.3 F (37.4 C) 99.7 F (37.6 C) 99.3 F (37.4 C) 98.6 F (37 C)  TempSrc: Oral Oral Oral Oral  SpO2: 100% 100% 100% 100%  Weight:      Height:        Intake/Output Summary (Last 24 hours) at 08/05/2024 1731 Last data filed at 08/05/2024 0600 Gross per 24 hour  Intake 756.54 ml  Output --  Net 756.54 ml   Filed Weights   08/03/24 1855  Weight: 68.5 kg    Examination:  General exam: Appears calm and comfortable  Respiratory system: Clear to auscultation. Respiratory effort normal. Cardiovascular system: S1 & S2 heard, RRR. No JVD, murmurs, rubs, gallops or clicks. No pedal edema. Gastrointestinal system: Abdomen is nondistended, soft and nontender. No organomegaly or  masses felt. Normal bowel sounds heard. Central nervous system: Alert and oriented. No focal neurological deficits. Extremities: Symmetric 5 x 5 power. Skin: No rashes, lesions or ulcers Psychiatry: Judgement and insight appear normal. Mood & affect appropriate.     Data Reviewed: I have personally reviewed following labs and imaging studies  CBC: Recent Labs  Lab 08/02/24 2002 08/03/24 2008 08/03/24 2012 08/05/24 0347  WBC 19.6*  --  17.6* 10.9*  NEUTROABS  17.2*  --  14.8*  --   HGB 11.4* 9.5* 9.9* 9.3*  HCT 33.0* 28.0* 27.3* 27.2*  MCV 89.2  --  86.4 90.4  PLT 228  --  308 310    Basic Metabolic Panel: Recent Labs  Lab 08/02/24 2002 08/03/24 2008 08/03/24 2012 08/05/24 0347  NA 136 137 137 139  K 4.2 4.4 3.4* 3.5  CL 102  --  104 105  CO2 15*  --  16* 21*  GLUCOSE 143*  --  123* 103*  BUN 9  --  8 <5*  CREATININE 1.22*  --  1.15* 0.82  CALCIUM 10.7*  --  10.0 9.4  MG  --   --   --  2.1    GFR: Estimated Creatinine Clearance: 79.5 mL/min (by C-G formula based on SCr of 0.82 mg/dL).  Liver Function Tests: Recent Labs  Lab 08/03/24 2012  AST 23  ALT 16  ALKPHOS 47  BILITOT 0.5  PROT 7.5  ALBUMIN 4.3    CBG: No results for input(s): GLUCAP in the last 168 hours.   Recent Results (from the past 240 hours)  Urine Culture     Status: Abnormal   Collection Time: 08/02/24  7:57 PM   Specimen: Urine, Clean Catch  Result Value Ref Range Status   Specimen Description   Final    URINE, CLEAN CATCH Performed at [facility], [address] [place], [place] [address]    Special Requests   Final    NONE Performed at [facility], [address] [place], [place] [address]    Culture >=100,000 COLONIES/mL ESCHERICHIA COLI (A)  Final   Report Status 08/05/2024 FINAL  Final   Organism ID, Bacteria ESCHERICHIA COLI (A)  Final      Susceptibility   Escherichia coli - MIC*    AMPICILLIN >=32 RESISTANT Resistant     CEFAZOLIN  (URINE) Value in next row Sensitive      8 SENSITIVEThis is a modified FDA-approved test that has been validated and its performance characteristics determined by the reporting laboratory.  This laboratory is certified under the Clinical Laboratory Improvement Amendments CLIA as qualified to perform high complexity clinical laboratory testing.    CEFEPIME Value in next row Sensitive      8 SENSITIVEThis is a modified FDA-approved test that has been validated  and its performance characteristics determined by the reporting laboratory.  This laboratory is certified under the Clinical Laboratory Improvement Amendments CLIA as qualified to perform high complexity clinical laboratory testing.    ERTAPENEM Value in next row Sensitive      8 SENSITIVEThis is a modified FDA-approved test that has been validated and its performance characteristics determined by the reporting laboratory.  This laboratory is certified under the Clinical Laboratory Improvement Amendments CLIA as qualified to perform high complexity clinical laboratory testing.    CEFTRIAXONE  Value in next row Sensitive      8 SENSITIVEThis is a modified FDA-approved test that has been validated and its performance characteristics determined by the reporting  laboratory.  This laboratory is certified under the Clinical Laboratory Improvement Amendments CLIA as qualified to perform high complexity clinical laboratory testing.    CIPROFLOXACIN  Value in next row Sensitive      8 SENSITIVEThis is a modified FDA-approved test that has been validated and its performance characteristics determined by the reporting laboratory.  This laboratory is certified under the Clinical Laboratory Improvement Amendments CLIA as qualified to perform high complexity clinical laboratory testing.    GENTAMICIN Value in next row Resistant      8 SENSITIVEThis is a modified FDA-approved test that has been validated and its performance characteristics determined by the reporting laboratory.  This laboratory is certified under the Clinical Laboratory Improvement Amendments CLIA as qualified to perform high complexity clinical laboratory testing.    NITROFURANTOIN Value in next row Sensitive      8 SENSITIVEThis is a modified FDA-approved test that has been validated and its performance characteristics determined by the reporting laboratory.  This laboratory is certified under the Clinical Laboratory Improvement Amendments CLIA as  qualified to perform high complexity clinical laboratory testing.    TRIMETH/SULFA Value in next row Sensitive      8 SENSITIVEThis is a modified FDA-approved test that has been validated and its performance characteristics determined by the reporting laboratory.  This laboratory is certified under the Clinical Laboratory Improvement Amendments CLIA as qualified to perform high complexity clinical laboratory testing.    AMPICILLIN/SULBACTAM Value in next row Intermediate      8 SENSITIVEThis is a modified FDA-approved test that has been validated and its performance characteristics determined by the reporting laboratory.  This laboratory is certified under the Clinical Laboratory Improvement Amendments CLIA as qualified to perform high complexity clinical laboratory testing.    PIP/TAZO Value in next row Sensitive ug/mL     <=4 SENSITIVEThis is a modified FDA-approved test that has been validated and its performance characteristics determined by the reporting laboratory.  This laboratory is certified under the Clinical Laboratory Improvement Amendments CLIA as qualified to perform high complexity clinical laboratory testing.    MEROPENEM Value in next row Sensitive      <=4 SENSITIVEThis is a modified FDA-approved test that has been validated and its performance characteristics determined by the reporting laboratory.  This laboratory is certified under the Clinical Laboratory Improvement Amendments CLIA as qualified to perform high complexity clinical laboratory testing.    * >=100,000 COLONIES/mL ESCHERICHIA COLI  Resp panel by RT-PCR (RSV, Flu A&B, Covid) Anterior Nasal Swab     Status: None   Collection Time: 08/02/24  8:02 PM   Specimen: Anterior Nasal Swab  Result Value Ref Range Status   SARS Coronavirus 2 by RT PCR NEGATIVE NEGATIVE Final    Comment: (NOTE) SARS-CoV-2 target nucleic acids are NOT DETECTED.  The SARS-CoV-2 RNA is generally detectable in upper respiratory specimens during the  acute phase of infection. The lowest concentration of SARS-CoV-2 viral copies this assay can detect is 138 copies/mL. A negative result does not preclude SARS-Cov-2 infection and should not be used as the sole basis for treatment or other patient management decisions. A negative result may occur with  improper specimen collection/handling, submission of specimen other than nasopharyngeal swab, presence of viral mutation(s) within the areas targeted by this assay, and inadequate number of viral copies(<138 copies/mL). A negative result must be combined with clinical observations, patient history, and epidemiological information. The expected result is Negative.  Fact Sheet for Patients:  BloggerCourse.com  Fact Sheet for Healthcare Providers:  SeriousBroker.it  This test is no t yet approved or cleared by the [place]  FDA and  has been authorized for detection and/or diagnosis of SARS-CoV-2 by FDA under an Emergency Use Authorization (EUA). This EUA will remain  in effect (meaning this test can be used) for the duration of the COVID-19 declaration under Section 564(b)(1) of the Act, 21 U.S.C.section 360bbb-3(b)(1), unless the authorization is terminated  or revoked sooner.       Influenza A by PCR NEGATIVE NEGATIVE Final   Influenza B by PCR NEGATIVE NEGATIVE Final    Comment: (NOTE) The Xpert Xpress SARS-CoV-2/FLU/RSV plus assay is intended as an aid in the diagnosis of influenza from Nasopharyngeal swab specimens and should not be used as a sole basis for treatment. Nasal washings and aspirates are unacceptable for Xpert Xpress SARS-CoV-2/FLU/RSV testing.  Fact Sheet for Patients: BloggerCourse.com  Fact Sheet for Healthcare Providers: SeriousBroker.it  This test is not yet approved or cleared by the [place]  FDA and has been authorized for detection and/or  diagnosis of SARS-CoV-2 by FDA under an Emergency Use Authorization (EUA). This EUA will remain in effect (meaning this test can be used) for the duration of the COVID-19 declaration under Section 564(b)(1) of the Act, 21 U.S.C. section 360bbb-3(b)(1), unless the authorization is terminated or revoked.     Resp Syncytial Virus by PCR NEGATIVE NEGATIVE Final    Comment: (NOTE) Fact Sheet for Patients: BloggerCourse.com  Fact Sheet for Healthcare Providers: SeriousBroker.it  This test is not yet approved or cleared by the [place]  FDA and has been authorized for detection and/or diagnosis of SARS-CoV-2 by FDA under an Emergency Use Authorization (EUA). This EUA will remain in effect (meaning this test can be used) for the duration of the COVID-19 declaration under Section 564(b)(1) of the Act, 21 U.S.C. section 360bbb-3(b)(1), unless the authorization is terminated or revoked.  Performed at [facility], [address] [place], [place] [address]   Culture, blood (routine x 2)     Status: None (Preliminary result)   Collection Time: 08/02/24  8:52 PM   Specimen: BLOOD  Result Value Ref Range Status   Specimen Description   Final    BLOOD RIGHT ANTECUBITAL Performed at [facility], [address] [place], [place] [address]    Special Requests   Final    BOTTLES DRAWN AEROBIC AND ANAEROBIC Blood Culture adequate volume Performed at [facility], [address] [place], [place] [address]    Culture   Final    NO GROWTH 2 DAYS Performed at [facility], [address], [place], [place] [address]    Report Status PENDING  Incomplete  Culture, blood (routine x 2)     Status: None (Preliminary result)   Collection Time: 08/02/24  8:59 PM   Specimen: BLOOD RIGHT FOREARM  Result Value Ref Range Status   Specimen Description   Final    BLOOD RIGHT  FOREARM Performed at [facility], [address], [place], [place] [address]    Special Requests   Final    BOTTLES DRAWN AEROBIC AND ANAEROBIC Blood Culture adequate volume Performed at [facility], [address], [place], [place] [address]    Culture   Final    NO GROWTH 2 DAYS Performed at [facility], [address] [place], [place] [address]    Report Status PENDING  Incomplete  Wet prep, genital     Status: None   Collection  Time: 08/03/24  8:12 PM   Specimen: Vaginal  Result Value Ref Range Status   Yeast Wet Prep HPF POC NONE SEEN NONE SEEN Final   Trich, Wet Prep NONE SEEN NONE SEEN Final   Clue Cells Wet Prep HPF POC NONE SEEN NONE SEEN Final   WBC, Wet Prep HPF POC <10 <10 Final   Sperm NONE SEEN  Final    Comment: Performed at [facility], [address], [place], [place] [address]  Blood culture (routine x 2)     Status: None (Preliminary result)   Collection Time: 08/03/24 10:30 PM   Specimen: BLOOD RIGHT FOREARM  Result Value Ref Range Status   Specimen Description   Final    BLOOD RIGHT FOREARM Performed at [facility], [address], [place], [place] [address]    Special Requests   Final    BOTTLES DRAWN AEROBIC AND ANAEROBIC Blood Culture results may not be optimal due to an inadequate volume of blood received in culture bottles Performed at [facility], [address], [place], [place] [address]    Culture   Final    NO GROWTH < 24 HOURS Performed at [facility], [address] [place], [place] [address]    Report Status PENDING  Incomplete         Radiology Studies: No results found.      Scheduled Meds:  enoxaparin  (LOVENOX ) injection  40 mg Subcutaneous Q24H   Continuous Infusions:  cefTRIAXone  (ROCEPHIN )  IV       LOS: 1 day    Time spent: 40 minutes    Toribio Hummer, MD [facility]   To contact the attending  provider between 7A-7P or the covering provider during after hours 7P-7A, please log into the web site [email or web address] and access using universal [place] password for that web site. If you do not have the password, please call the hospital operator.  08/05/2024, 5:31 PM

## 2024-08-06 ENCOUNTER — Telehealth (HOSPITAL_BASED_OUTPATIENT_CLINIC_OR_DEPARTMENT_OTHER): Payer: Self-pay | Admitting: *Deleted

## 2024-08-06 LAB — CBC
HCT: 29.2 % — ABNORMAL LOW (ref 36.0–46.0)
Hemoglobin: 10 g/dL — ABNORMAL LOW (ref 12.0–15.0)
MCH: 30.7 pg (ref 26.0–34.0)
MCHC: 34.2 g/dL (ref 30.0–36.0)
MCV: 89.6 fL (ref 80.0–100.0)
Platelets: 368 K/uL (ref 150–400)
RBC: 3.26 MIL/uL — ABNORMAL LOW (ref 3.87–5.11)
RDW: 11.9 % (ref 11.5–15.5)
WBC: 8 K/uL (ref 4.0–10.5)
nRBC: 0 % (ref 0.0–0.2)

## 2024-08-06 LAB — BASIC METABOLIC PANEL WITH GFR
Anion gap: 14 (ref 5–15)
BUN: 5 mg/dL — ABNORMAL LOW (ref 6–20)
CO2: 22 mmol/L (ref 22–32)
Calcium: 10.3 mg/dL (ref 8.9–10.3)
Chloride: 103 mmol/L (ref 98–111)
Creatinine, Ser: 0.89 mg/dL (ref 0.44–1.00)
GFR, Estimated: >60 mL/min (ref >60)
Glucose, Bld: 95 mg/dL (ref 70–99)
Potassium: 4 mmol/L (ref 3.5–5.1)
Sodium: 139 mmol/L (ref 135–145)

## 2024-08-06 LAB — MAGNESIUM: Magnesium: 2.4 mg/dL (ref 1.7–2.4)

## 2024-08-06 MED ORDER — CIPROFLOXACIN HCL 500 MG PO TABS
500.0000 mg | ORAL_TABLET | Freq: Two times a day (BID) | ORAL | Status: DC
  Administered 2024-08-06: 500 mg via ORAL
  Filled 2024-08-06: qty 1

## 2024-08-06 MED ORDER — ONDANSETRON HCL 4 MG PO TABS: 4.0000 mg | ORAL_TABLET | Freq: Four times a day (QID) | ORAL | 0 refills | Status: AC | PRN

## 2024-08-06 MED ORDER — CIPROFLOXACIN HCL 500 MG PO TABS: 500.0000 mg | ORAL_TABLET | Freq: Two times a day (BID) | ORAL | 0 refills | Status: AC

## 2024-08-06 NOTE — Discharge Instructions (Addendum)
https://www.healthcare.gov/

## 2024-08-06 NOTE — Telephone Encounter (Signed)
 Post ED Visit - Positive Culture Follow-up  Culture report reviewed by antimicrobial stewardship pharmacist: [facility] []  Rankin Dee, Pharm.D. []  Venetia Gully, Pharm.D., BCPS AQ-ID []  Garrel Crews, Pharm.D., BCPS []  Almarie Lunger, Pharm.D., BCPS []  [place], [address].D., BCPS, AAHIVP []  Rosaline Bihari, Pharm.D., BCPS, AAHIVP []  Vernell Meier, PharmD, BCPS []  Latanya Hint, PharmD, BCPS []  Donald Medley, PharmD, BCPS []  Rocky Bold, PharmD []  Dorothyann Alert, PharmD, BCPS [x] Dorn Edelson, PharmD  [facility] []  Rosaline Edison, PharmD []  Romona Bliss, PharmD []  Dolphus Roller, PharmD []  Veva Seip, Rph []  Vernell Daunt) Leonce, PharmD []  Eva Allis, PharmD []  Rosaline Millet, PharmD []  Iantha Batch, PharmD []  Arvin Gauss, PharmD []  Wanda Hasting, PharmD []  Ronal Rav, PharmD []  Rocky Slade, PharmD []  Bard Jeans, PharmD   Positive urine culture Treated with Cephalexin , organism sensitive to the same and no further patient follow-up is required at this time.  Jacqueline Carter 08/06/2024, 10:50 AM

## 2024-08-06 NOTE — Discharge Summary (Signed)
 Physician Discharge Summary  Jacqueline Carter FMW:[id number] DOB: 17-Feb-1981 DOA: 08/03/2024  PCP: Benjamine Aland, MD  Admit date: 08/03/2024 Discharge date: 08/06/2024  Time spent: 60 minutes  Recommendations for Outpatient Follow-up:  Follow-up with Benjamine Aland, MD in 2 weeks.  On follow-up patient will need a basic metabolic profile done to follow-up on electrolytes and renal function.  UTI will need to be followed up upon.   Discharge Diagnoses:  Principal Problem:   Sepsis secondary to UTI [facility]) Active Problems:   UTI (urinary tract infection)   Hypokalemia   Chronic anemia   Discharge Condition: Stable and improved.  Diet recommendation: Regular  Filed Weights   08/03/24 1855  Weight: 68.5 kg    History of present illness:  HPI per Dr. [name] is a 43 y.o. female with medical history significant for prediabetes not on prescription medications, who was diagnosed with sepsis due to UTI on the evening of 9/9, eventually sent home and now returns for admission due to continued sepsis related to UTI.  She was diagnosed in the emergency department initially on 9/9, was accepted for hospitalist admission due to sepsis due to UTI.  She waited for a bed through the night, the following morning was feeling much better with sepsis physiology resolved and was therefore discharged home in stable condition with oral antibiotics on 9/10.  Later that evening, she continued to feel unwell, felt tachypnea, fevers, chills, bilateral tingling to her hands and legs.  She returned to the ER for evaluation, where she was found to have tachycardia, persistent leukocytosis, elevated lactic acid 2.4.  She was given IV fluids, empiric IV Rocephin , and admitted to the hospitalist service.   Hospital Course:  #1 severe sepsis secondary to UTI -Patient met criteria for sepsis on admission with tachycardia, leukocytosis, UTI, endorgan dysfunction with initial lactate of 2.4. -  Patient improved clinically, remained hemodynamically stable read resolving sepsis physiology and normalization of lactic acid level.  -Patient on presentation placed on IV Rocephin .   - Leukocytosis trended down, and had resolved by day of discharge.  -Patient remained afebrile.  - Blood cultures obtained on admission with no growth to date x 3 days.  - Urine cultures obtained on initial presentation to the ED on 08/02/2024 with E. coli.  - Patient improved on IV Rocephin  was subsequently transitioned to oral ciprofloxacin  which she tolerated and patient will be discharged home on 5 more days of oral ciprofloxacin .  - Outpatient follow-up with PCP.     2.  Chronic anemia without history of bleeding -CT abdomen and pelvis done with fibroids noted. - Hemoglobin currently stable at 10.0. - Outpatient follow-up with PCP.   3.  Hypokalemia -Repleted.  Procedures: CT abdomen and pelvis 08/02/2024 Chest x-ray 08/02/2024    Consultations: None  Discharge Exam: Vitals:   08/06/24 0539 08/06/24 1325  BP: 115/84 112/88  Pulse: 71 70  Resp: 12 16  Temp: 98.2 F (36.8 C) 98.4 F (36.9 C)  SpO2: 100% 100%    General: NAD Cardiovascular: RRR no murmurs rubs or gallops.  No JVD.  No lower extremity edema. Respiratory: Clear to auscultation bilaterally.  No wheezes, no crackles, no rhonchi.  Fair air movement.  Speaking in full sentences  Discharge Instructions   Discharge Instructions     Diet general   Complete by: As directed    Increase activity slowly   Complete by: As directed       Allergies as of 08/06/2024   No Known  Allergies      Medication List     STOP taking these medications    cephALEXin  500 MG capsule Commonly known as: KEFLEX        TAKE these medications    acetaminophen  500 MG tablet Commonly known as: TYLENOL  Take 500 mg by mouth as needed for headache.   Biotin 5000 MCG Caps Take 5,000 mcg by mouth daily.   ciprofloxacin  500 MG  tablet Commonly known as: CIPRO  Take 1 tablet (500 mg total) by mouth 2 (two) times daily for 5 days.   cyanocobalamin  500 MCG tablet Commonly known as: VITAMIN B12 Take 500 mcg by mouth daily.   ibuprofen  200 MG tablet Commonly known as: ADVIL  Take 200 mg by mouth as needed for headache.   ondansetron  4 MG tablet Commonly known as: ZOFRAN  Take 1 tablet (4 mg total) by mouth every 6 (six) hours as needed for nausea.   OVER THE COUNTER MEDICATION Take 3 tablets by mouth daily. **meno-chew for perimenopause** Chews       No Known Allergies  Follow-up Information     Benjamine Aland, MD. Schedule an appointment as soon as possible for a visit in 2 week(s).   Specialty: Family Medicine Contact information: [address] [place], [address] [place] [address] [phone]                  The results of significant diagnostics from this hospitalization (including imaging, microbiology, ancillary and laboratory) are listed below for reference.    Significant Diagnostic Studies: CT ABDOMEN PELVIS W CONTRAST Result Date: 08/02/2024 EXAM: CT ABDOMEN AND PELVIS WITH CONTRAST 08/02/2024 10:42:14 PM TECHNIQUE: CT of the abdomen and pelvis was performed with the administration of intravenous contrast. Multiplanar reformatted images are provided for review. Automated exposure control, iterative reconstruction, and/or weight-based adjustment of the mA/kV was utilized to reduce the radiation dose to as low as reasonably achievable. COMPARISON: Right CT 02/14/2018 CLINICAL HISTORY: UTI, recurrent/complicated (Female). Table formatting from the original note was not included.; Pt presents via POV c/o body aches and legs burning since yesterday. Reports some emesis. Reports shaking and chills. UTI, recurrent/complicated FINDINGS: LOWER CHEST: No acute abnormality. LIVER: The liver is unremarkable. GALLBLADDER AND BILE DUCTS: Gallbladder is unremarkable. No biliary ductal dilatation. SPLEEN: No acute  abnormality. PANCREAS: No acute abnormality. ADRENAL GLANDS: No acute abnormality. KIDNEYS, URETERS AND BLADDER: No stones in the kidneys or ureters. No hydronephrosis. No perinephric or periureteral stranding. Urinary bladder is unremarkable. GI AND BOWEL: Stomach demonstrates no acute abnormality. There is no bowel obstruction. PERITONEUM AND RETROPERITONEUM: Trace free fluid in the pelvis is likely physiologic. No free intraperitoneal air. VASCULATURE: Aorta is normal in caliber. LYMPH NODES: No lymphadenopathy. REPRODUCTIVE ORGANS: Heterogeneous enhancement of the uterus suggesting multiple fibroids. BONES AND SOFT TISSUES: No acute osseous abnormality. No focal soft tissue abnormality. IMPRESSION: 1. No acute findings in the abdomen or pelvis. 2. Heterogeneous enhancement of the uterus suggesting multiple fibroids. Electronically signed by: Norman Gatlin MD 08/02/2024 10:55 PM EDT RP Workstation: [id number]   DG Chest Portable 1 View Result Date: 08/02/2024 CLINICAL DATA:  Concern for pneumonia.  Body aches. EXAM: PORTABLE CHEST 1 VIEW COMPARISON:  None Available. FINDINGS: No focal consolidation, pleural effusion, pneumothorax. The cardiac silhouette is within normal limits. No acute osseous pathology. IMPRESSION: No active disease. Electronically Signed   By: Vanetta Chou M.D.   On: 08/02/2024 21:27    Microbiology: Recent Results (from the past 240 hours)  Urine Culture     Status:  Abnormal   Collection Time: 08/02/24  7:57 PM   Specimen: Urine, Clean Catch  Result Value Ref Range Status   Specimen Description   Final    URINE, CLEAN CATCH Performed at [facility], [address], [place], [place] [address]    Special Requests   Final    NONE Performed at [facility], [address], [place], [place] [address]    Culture >=100,000 COLONIES/mL ESCHERICHIA COLI (A)  Final   Report Status 08/05/2024 FINAL  Final   Organism ID, Bacteria  ESCHERICHIA COLI (A)  Final      Susceptibility   Escherichia coli - MIC*    AMPICILLIN >=32 RESISTANT Resistant     CEFAZOLIN  (URINE) Value in next row Sensitive      8 SENSITIVEThis is a modified FDA-approved test that has been validated and its performance characteristics determined by the reporting laboratory.  This laboratory is certified under the Clinical Laboratory Improvement Amendments CLIA as qualified to perform high complexity clinical laboratory testing.    CEFEPIME Value in next row Sensitive      8 SENSITIVEThis is a modified FDA-approved test that has been validated and its performance characteristics determined by the reporting laboratory.  This laboratory is certified under the Clinical Laboratory Improvement Amendments CLIA as qualified to perform high complexity clinical laboratory testing.    ERTAPENEM Value in next row Sensitive      8 SENSITIVEThis is a modified FDA-approved test that has been validated and its performance characteristics determined by the reporting laboratory.  This laboratory is certified under the Clinical Laboratory Improvement Amendments CLIA as qualified to perform high complexity clinical laboratory testing.    CEFTRIAXONE  Value in next row Sensitive      8 SENSITIVEThis is a modified FDA-approved test that has been validated and its performance characteristics determined by the reporting laboratory.  This laboratory is certified under the Clinical Laboratory Improvement Amendments CLIA as qualified to perform high complexity clinical laboratory testing.    CIPROFLOXACIN  Value in next row Sensitive      8 SENSITIVEThis is a modified FDA-approved test that has been validated and its performance characteristics determined by the reporting laboratory.  This laboratory is certified under the Clinical Laboratory Improvement Amendments CLIA as qualified to perform high complexity clinical laboratory testing.    GENTAMICIN Value in next row Resistant      8  SENSITIVEThis is a modified FDA-approved test that has been validated and its performance characteristics determined by the reporting laboratory.  This laboratory is certified under the Clinical Laboratory Improvement Amendments CLIA as qualified to perform high complexity clinical laboratory testing.    NITROFURANTOIN Value in next row Sensitive      8 SENSITIVEThis is a modified FDA-approved test that has been validated and its performance characteristics determined by the reporting laboratory.  This laboratory is certified under the Clinical Laboratory Improvement Amendments CLIA as qualified to perform high complexity clinical laboratory testing.    TRIMETH/SULFA Value in next row Sensitive      8 SENSITIVEThis is a modified FDA-approved test that has been validated and its performance characteristics determined by the reporting laboratory.  This laboratory is certified under the Clinical Laboratory Improvement Amendments CLIA as qualified to perform high complexity clinical laboratory testing.    AMPICILLIN/SULBACTAM Value in next row Intermediate      8 SENSITIVEThis is a modified FDA-approved test that has been validated and its performance characteristics determined by the reporting laboratory.  This laboratory is  certified under the Clinical Laboratory Improvement Amendments CLIA as qualified to perform high complexity clinical laboratory testing.    PIP/TAZO Value in next row Sensitive ug/mL     <=4 SENSITIVEThis is a modified FDA-approved test that has been validated and its performance characteristics determined by the reporting laboratory.  This laboratory is certified under the Clinical Laboratory Improvement Amendments CLIA as qualified to perform high complexity clinical laboratory testing.    MEROPENEM Value in next row Sensitive      <=4 SENSITIVEThis is a modified FDA-approved test that has been validated and its performance characteristics determined by the reporting laboratory.  This  laboratory is certified under the Clinical Laboratory Improvement Amendments CLIA as qualified to perform high complexity clinical laboratory testing.    * >=100,000 COLONIES/mL ESCHERICHIA COLI  Resp panel by RT-PCR (RSV, Flu A&B, Covid) Anterior Nasal Swab     Status: None   Collection Time: 08/02/24  8:02 PM   Specimen: Anterior Nasal Swab  Result Value Ref Range Status   SARS Coronavirus 2 by RT PCR NEGATIVE NEGATIVE Final    Comment: (NOTE) SARS-CoV-2 target nucleic acids are NOT DETECTED.  The SARS-CoV-2 RNA is generally detectable in upper respiratory specimens during the acute phase of infection. The lowest concentration of SARS-CoV-2 viral copies this assay can detect is 138 copies/mL. A negative result does not preclude SARS-Cov-2 infection and should not be used as the sole basis for treatment or other patient management decisions. A negative result may occur with  improper specimen collection/handling, submission of specimen other than nasopharyngeal swab, presence of viral mutation(s) within the areas targeted by this assay, and inadequate number of viral copies(<138 copies/mL). A negative result must be combined with clinical observations, patient history, and epidemiological information. The expected result is Negative.  Fact Sheet for Patients:  BloggerCourse.com  Fact Sheet for Healthcare Providers:  SeriousBroker.it  This test is no t yet approved or cleared by the [place]  FDA and  has been authorized for detection and/or diagnosis of SARS-CoV-2 by FDA under an Emergency Use Authorization (EUA). This EUA will remain  in effect (meaning this test can be used) for the duration of the COVID-19 declaration under Section 564(b)(1) of the Act, 21 U.S.C.section 360bbb-3(b)(1), unless the authorization is terminated  or revoked sooner.       Influenza A by PCR NEGATIVE NEGATIVE Final   Influenza B by PCR NEGATIVE  NEGATIVE Final    Comment: (NOTE) The Xpert Xpress SARS-CoV-2/FLU/RSV plus assay is intended as an aid in the diagnosis of influenza from Nasopharyngeal swab specimens and should not be used as a sole basis for treatment. Nasal washings and aspirates are unacceptable for Xpert Xpress SARS-CoV-2/FLU/RSV testing.  Fact Sheet for Patients: BloggerCourse.com  Fact Sheet for Healthcare Providers: SeriousBroker.it  This test is not yet approved or cleared by the [place]  FDA and has been authorized for detection and/or diagnosis of SARS-CoV-2 by FDA under an Emergency Use Authorization (EUA). This EUA will remain in effect (meaning this test can be used) for the duration of the COVID-19 declaration under Section 564(b)(1) of the Act, 21 U.S.C. section 360bbb-3(b)(1), unless the authorization is terminated or revoked.     Resp Syncytial Virus by PCR NEGATIVE NEGATIVE Final    Comment: (NOTE) Fact Sheet for Patients: BloggerCourse.com  Fact Sheet for Healthcare Providers: SeriousBroker.it  This test is not yet approved or cleared by the [place]  FDA and has been authorized for detection and/or diagnosis of SARS-CoV-2 by FDA under  an Emergency Use Authorization (EUA). This EUA will remain in effect (meaning this test can be used) for the duration of the COVID-19 declaration under Section 564(b)(1) of the Act, 21 U.S.C. section 360bbb-3(b)(1), unless the authorization is terminated or revoked.  Performed at [facility], [address], [place], [place] [address]   Culture, blood (routine x 2)     Status: None (Preliminary result)   Collection Time: 08/02/24  8:52 PM   Specimen: BLOOD  Result Value Ref Range Status   Specimen Description   Final    BLOOD RIGHT ANTECUBITAL Performed at [facility], [address], [place],  [place] [address]    Special Requests   Final    BOTTLES DRAWN AEROBIC AND ANAEROBIC Blood Culture adequate volume Performed at [facility], [address], [place], [place] [address]    Culture   Final    NO GROWTH 3 DAYS Performed at [facility], [address] [place], [place] [address]    Report Status PENDING  Incomplete  Culture, blood (routine x 2)     Status: None (Preliminary result)   Collection Time: 08/02/24  8:59 PM   Specimen: BLOOD RIGHT FOREARM  Result Value Ref Range Status   Specimen Description   Final    BLOOD RIGHT FOREARM Performed at [facility], [address], [place], [place] [address]    Special Requests   Final    BOTTLES DRAWN AEROBIC AND ANAEROBIC Blood Culture adequate volume Performed at [facility], [address] [place], [place] [address]    Culture   Final    NO GROWTH 3 DAYS Performed at [facility], [address] [place], [place] [address]    Report Status PENDING  Incomplete  Wet prep, genital     Status: None   Collection Time: 08/03/24  8:12 PM   Specimen: Vaginal  Result Value Ref Range Status   Yeast Wet Prep HPF POC NONE SEEN NONE SEEN Final   Trich, Wet Prep NONE SEEN NONE SEEN Final   Clue Cells Wet Prep HPF POC NONE SEEN NONE SEEN Final   WBC, Wet Prep HPF POC <10 <10 Final   Sperm NONE SEEN  Final    Comment: Performed at [facility], [address] [place], [place] [address]  Blood culture (routine x 2)     Status: None (Preliminary result)   Collection Time: 08/03/24 10:20 PM   Specimen: Right Antecubital; Blood  Result Value Ref Range Status   Specimen Description   Final    RIGHT ANTECUBITAL Performed at [facility], [address] [place], [place] [address]    Special Requests   Final    BOTTLES DRAWN AEROBIC AND ANAEROBIC Blood Culture adequate volume Performed at [facility], [address] [place], [place] [address]    Culture   Final    NO GROWTH 3 DAYS Performed at [facility], [address] [place], [place] [address]    Report Status PENDING  Incomplete  Blood culture (routine x 2)     Status: None (Preliminary result)   Collection Time: 08/03/24 10:30 PM   Specimen: BLOOD RIGHT FOREARM  Result Value Ref Range Status   Specimen Description   Final    BLOOD RIGHT FOREARM Performed at [facility], [address] [place], [place] [address]    Special Requests   Final    BOTTLES DRAWN AEROBIC  AND ANAEROBIC Blood Culture results may not be optimal due to an inadequate volume of blood received in culture bottles Performed at [facility], [address] [place], [place] [address]    Culture   Final    NO GROWTH 2 DAYS Performed at [facility], [address] [place], [place] [address]    Report Status PENDING  Incomplete     Labs: Basic Metabolic Panel: Recent Labs  Lab 08/02/24 2002 08/03/24 2008 08/03/24 2012 08/05/24 0347 08/06/24 0444  NA 136 137 137 139 139  K 4.2 4.4 3.4* 3.5 4.0  CL 102  --  104 105 103  CO2 15*  --  16* 21* 22  GLUCOSE 143*  --  123* 103* 95  BUN 9  --  8 <5* 5*  CREATININE 1.22*  --  1.15* 0.82 0.89  CALCIUM 10.7*  --  10.0 9.4 10.3  MG  --   --   --  2.1 2.4   Liver Function Tests: Recent Labs  Lab 08/03/24 2012  AST 23  ALT 16  ALKPHOS 47  BILITOT 0.5  PROT 7.5  ALBUMIN 4.3   No results for input(s): LIPASE, AMYLASE in the last 168 hours. No results for input(s): AMMONIA in the last 168 hours. CBC: Recent Labs  Lab 08/02/24 2002 08/03/24 2008 08/03/24 2012 08/05/24 0347 08/06/24 0444  WBC 19.6*  --  17.6* 10.9* 8.0  NEUTROABS 17.2*  --  14.8*  --   --   HGB 11.4* 9.5* 9.9* 9.3* 10.0*  HCT 33.0* 28.0* 27.3* 27.2* 29.2*  MCV 89.2  --  86.4 90.4 89.6  PLT 228  --  308 310 368   Cardiac Enzymes: No results for input(s): CKTOTAL, CKMB,  CKMBINDEX, TROPONINI in the last 168 hours. BNP: BNP (last 3 results) No results for input(s): BNP in the last 8760 hours.  ProBNP (last 3 results) No results for input(s): PROBNP in the last 8760 hours.  CBG: No results for input(s): GLUCAP in the last 168 hours.     Signed:  Toribio Hummer MD.  [facility] 08/06/2024, 2:40 PM

## 2024-08-06 NOTE — TOC Initial Note (Signed)
 Transition of Care [facility]) - Initial/Assessment Note    Patient Details  Name: Jacqueline Carter MRN: [id number] Date of Birth: 08/19/1981  Transition of Care [facility]) CM/SW Contact:    Sonda Manuella Quill, RN Phone Number: 08/06/2024, 3:33 PM  Clinical Narrative:                 No insurance listed; spoke w/ pt over phone; pt said she lives at home w/ her husband Rodgers Birmingham [phone]); she plans to return at d/c; he will provide transport; pt verified insurance; pt said she does not have insurance; she denied SDOH risks; pt does not have DME, HH services, or home oxygen; she agreed to receive resources for social services, and marketplace for insurance; [employer]; pt said she can access [facility] for resources; she will make her own appt w/ agency of choice; no TOC needs.  Expected Discharge Plan: Home/Self Care Barriers to Discharge: No Barriers Identified   Patient Goals and CMS Choice Patient states their goals for this hospitalization and ongoing recovery are:: home          Expected Discharge Plan and Services   Discharge Planning Services: CM Consult   Living arrangements for the past 2 months: Single Family Home Expected Discharge Date: 08/06/24               DME Arranged: N/A DME Agency: NA       HH Arranged: NA HH Agency: NA        Prior Living Arrangements/Services Living arrangements for the past 2 months: Single Family Home Lives with:: Spouse Patient language and need for interpreter reviewed:: Yes Do you feel safe going back to the place where you live?: Yes      Need for Family Participation in Patient Care: Yes (Comment) Care giver support system in place?: Yes (comment) Current home services:  (n/a) Criminal Activity/Legal Involvement Pertinent to Current Situation/Hospitalization: No - Comment as needed  Activities of Daily Living   ADL Screening (condition at time of admission) Independently performs ADLs?: Yes  (appropriate for developmental age) Is the patient deaf or have difficulty hearing?: No Does the patient have difficulty seeing, even when wearing glasses/contacts?: No Does the patient have difficulty concentrating, remembering, or making decisions?: No  Permission Sought/Granted Permission sought to share information with : Case Manager Permission granted to share information with : Yes, Verbal Permission Granted  Share Information with NAME: Case Manager     Permission granted to share info w Relationship: Rodgers Birmingham (spouse) [phone]     Emotional Assessment Appearance::  (unable to assess) Attitude/Demeanor/Rapport: Gracious Affect (typically observed): Accepting Orientation: : Oriented to Self, Oriented to Place, Oriented to  Time, Oriented to Situation Alcohol / Substance Use: Not Applicable Psych Involvement: No (comment)  Admission diagnosis:  Sepsis secondary to UTI (HCC) [A41.9, N39.0] Urinary tract infection with hematuria, site unspecified [N39.0, R31.9] Sepsis without acute organ dysfunction, due to unspecified organism [facility]) [A41.9] Patient Active Problem List   Diagnosis Date Noted   Hypokalemia 08/05/2024   Chronic anemia 08/05/2024   Sepsis secondary to UTI (HCC) 08/03/2024   UTI (urinary tract infection) 08/02/2024   Acute appendicitis 02/14/2018   PCP:  Benjamine Aland, MD Pharmacy:   [facility] [address], [place] - [address] [place] [address] Phone: [phone] Fax: [phone]     Social Drivers of Health (SDOH) Social History: SDOH Screenings   Food Insecurity: No Food Insecurity (08/06/2024)  Housing: Low Risk  (  08/06/2024)  Transportation Needs: No Transportation Needs (08/06/2024)  Utilities: Not At Risk (08/06/2024)  Financial Resource Strain: Patient Declined (06/15/2023)   Received [facility]  Physical Activity: Insufficiently Active (06/15/2023)   Received from  [facility]  Social Connections: Socially Integrated (06/15/2023)   Received from [facility]  Stress: Stress Concern Present (06/15/2023)   Received from [facility]  Tobacco Use: Low Risk  (08/03/2024)   SDOH Interventions: Food Insecurity Interventions: Intervention Not Indicated, Inpatient TOC Housing Interventions: Intervention Not Indicated, Inpatient TOC Transportation Interventions: Inpatient TOC Utilities Interventions: Intervention Not Indicated, Inpatient TOC   Readmission Risk Interventions     No data to display

## 2024-08-08 LAB — CULTURE, BLOOD (ROUTINE X 2)
Culture: NO GROWTH
Culture: NO GROWTH
Culture: NO GROWTH
Special Requests: ADEQUATE
Special Requests: ADEQUATE
Special Requests: ADEQUATE

## 2024-08-09 LAB — CULTURE, BLOOD (ROUTINE X 2): Culture: NO GROWTH
